# Patient Record
Sex: Male | Born: 1960 | Race: Black or African American | Hispanic: No | Marital: Married | Smoking: Never smoker
Health system: Southern US, Community
[De-identification: ages and names within clinical notes are randomized; demographics above are authoritative.]

## PROBLEM LIST (undated history)

## (undated) ENCOUNTER — Emergency Department (HOSPITAL_COMMUNITY): Discharge status: Absent without leave | Payer: No Typology Code available for payment source

## (undated) ENCOUNTER — Ambulatory Visit (HOSPITAL_COMMUNITY): Source: Home / Self Care

## (undated) DIAGNOSIS — I10 Essential (primary) hypertension: Secondary | ICD-10-CM

## (undated) DIAGNOSIS — G43909 Migraine, unspecified, not intractable, without status migrainosus: Secondary | ICD-10-CM

## (undated) DIAGNOSIS — R339 Retention of urine, unspecified: Secondary | ICD-10-CM

---

## 2014-07-05 DIAGNOSIS — R109 Unspecified abdominal pain: Secondary | ICD-10-CM | POA: Insufficient documentation

## 2014-07-05 DIAGNOSIS — N138 Other obstructive and reflux uropathy: Secondary | ICD-10-CM | POA: Insufficient documentation

## 2014-07-05 DIAGNOSIS — N401 Enlarged prostate with lower urinary tract symptoms: Secondary | ICD-10-CM | POA: Insufficient documentation

## 2014-09-14 DIAGNOSIS — G8929 Other chronic pain: Secondary | ICD-10-CM | POA: Insufficient documentation

## 2014-09-14 DIAGNOSIS — K644 Residual hemorrhoidal skin tags: Secondary | ICD-10-CM | POA: Insufficient documentation

## 2014-09-14 DIAGNOSIS — M545 Low back pain, unspecified: Secondary | ICD-10-CM | POA: Insufficient documentation

## 2017-10-23 DIAGNOSIS — I10 Essential (primary) hypertension: Secondary | ICD-10-CM | POA: Insufficient documentation

## 2017-10-23 DIAGNOSIS — F5102 Adjustment insomnia: Secondary | ICD-10-CM | POA: Insufficient documentation

## 2018-04-02 DIAGNOSIS — E119 Type 2 diabetes mellitus without complications: Secondary | ICD-10-CM | POA: Insufficient documentation

## 2018-06-01 ENCOUNTER — Emergency Department (HOSPITAL_COMMUNITY)
Admission: EM | Admit: 2018-06-01 | Discharge: 2018-06-01 | Disposition: A | Discharge status: Home / Self Care | Payer: Medicaid Other | Attending: Emergency Medicine | Admitting: Emergency Medicine

## 2018-06-01 ENCOUNTER — Emergency Department (HOSPITAL_COMMUNITY): Payer: Medicaid Other

## 2018-06-01 ENCOUNTER — Encounter (HOSPITAL_COMMUNITY): Payer: Self-pay

## 2018-06-01 ENCOUNTER — Other Ambulatory Visit: Payer: Self-pay

## 2018-06-01 DIAGNOSIS — R51 Headache: Secondary | ICD-10-CM | POA: Diagnosis not present

## 2018-06-01 DIAGNOSIS — M542 Cervicalgia: Secondary | ICD-10-CM | POA: Insufficient documentation

## 2018-06-01 DIAGNOSIS — M546 Pain in thoracic spine: Secondary | ICD-10-CM | POA: Diagnosis not present

## 2018-06-01 HISTORY — DX: Retention of urine, unspecified: R33.9

## 2018-06-01 HISTORY — DX: Essential (primary) hypertension: I10

## 2018-06-01 MED ORDER — KETOROLAC TROMETHAMINE 30 MG/ML IJ SOLN
15.0000 mg | Freq: Once | INTRAMUSCULAR | Status: AC
  Administered 2018-06-01: 19:00:00 15 mg via INTRAMUSCULAR
  Filled 2018-06-01: qty 1
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
## 2018-07-09 ENCOUNTER — Emergency Department (HOSPITAL_COMMUNITY)
Admission: EM | Admit: 2018-07-09 | Discharge: 2018-07-09 | Disposition: A | Discharge status: Home / Self Care | Payer: Medicaid Other | Attending: Emergency Medicine | Admitting: Emergency Medicine

## 2018-07-09 ENCOUNTER — Other Ambulatory Visit: Payer: Self-pay

## 2018-07-09 ENCOUNTER — Encounter (HOSPITAL_COMMUNITY): Payer: Self-pay | Admitting: Emergency Medicine

## 2018-07-09 DIAGNOSIS — M542 Cervicalgia: Secondary | ICD-10-CM | POA: Diagnosis not present

## 2018-07-09 DIAGNOSIS — I10 Essential (primary) hypertension: Secondary | ICD-10-CM | POA: Insufficient documentation

## 2018-07-09 HISTORY — DX: Migraine, unspecified, not intractable, without status migrainosus: G43.909

## 2018-07-09 MED ORDER — NAPROXEN 500 MG PO TABS: 500.0000 mg | ORAL_TABLET | Freq: Two times a day (BID) | ORAL | 0 refills | Status: DC

## 2018-07-09 NOTE — ED Notes (Signed)
Patient verbalizes understanding of discharge instructions. Opportunity for questioning and answers were provided. Armband removed by staff, pt discharged from ED.  

## 2018-07-09 NOTE — Discharge Instructions (Addendum)
????? ??? ?? ?? ???? ???? ??? ??? ?? ????? ???? ?? ????? ???? ????? ?? ??? ??   500 ??? ? 500 ??? 1000 ?.?. ???? ? 4000 ?.?. ? Tylenol ??????  ???? ??? ???? ????? ???? ? ???? ??? ????? ? ???? ? 20 ????? ??? 2-3 ?? ??? ?? ?? ?????? ?? ?? ??? ???? ???? ???? ??  ?????? ???? ????? ???? ???? ??? ?????? ?????? ????? ???? ??  ??? ???? ? ???? ? ???? ???? ?? ?????? ??? ?????? ?????? ?? ?? ????? ??? ???? ??  Take naproxen twice daily with food for your neck pain and headaches.  You can also take 500 to 1000 mg of Tylenol every 6 hours as needed for pain.  Do not exceed more than 4000 mg of Tylenol daily.  You can apply ice or heat, whichever feels best, 20 minutes at a time 2-3 times daily to areas of pain and do some gentle stretching.  I have attached information for local doctors offices that you can call and set up an appointment.  Return to the emergency department if any concerning signs or symptoms develop such as fevers, weakness, vision changes.

## 2018-07-09 NOTE — ED Triage Notes (Signed)
Patient reports headache x 1 week - endorses history of headaches and takes medication for it and for HTN but states it is still there, though it is very mild today. Denies other symptoms - no fevers/chills, N/V, dizziness.

## 2018-07-09 NOTE — ED Provider Notes (Signed)
[facility] EMERGENCY DEPARTMENT Provider Note   CSN: [id number] Arrival date & time: 07/09/18  1250    History   Chief Complaint Chief Complaint  Patient presents with  . Headache    HPI Corey Mason is a 58 y.o. male with history of hypertension, migraines, type 2 diabetes mellitus, hyperlipidemia, BPH presents for evaluation of left-sided neck pain for 1 week.  He reports the pain is mild, improves with Tylenol.  It does not radiate down the left upper extremity but at times radiates up to the occiput, and is not associated with any numbness or weakness of the upper extremities.  He denies.  No vision changes.  Denies nausea, vomiting, chest pain, shortness of breath.  He reports he had similar pains in the past.  Recent injuries.  He is also requesting resources to establish care with a primary care provider in the area. He speaks Amharic and Arabic, and I was able to communicate with him as Arabic is my first language.      The history is provided by the patient. The history is limited by a language barrier.    Past Medical History:  Diagnosis Date  . Hypertension   . Migraines     There are no active problems to display for this patient.      Home Medications    Prior to Admission medications   Medication Sig Start Date End Date Taking? Authorizing Provider  naproxen (NAPROSYN) 500 MG tablet Take 1 tablet (500 mg total) by mouth 2 (two) times daily with a meal. 07/09/18   Nils Flack, Gavin Pound, PA-C    Family History No family history on file.  Social History Social History   Tobacco Use  . Smoking status: Not on file  Substance Use Topics  . Alcohol use: Not on file  . Drug use: Not on file     Allergies   Patient has no allergy information on record.   Review of Systems Review of Systems  Constitutional: Negative for chills and fever.  Eyes: Negative for photophobia and visual disturbance.  Gastrointestinal: Negative for nausea.   Musculoskeletal: Positive for neck pain. Negative for back pain and neck stiffness.  Neurological: Negative for weakness and numbness.  All other systems reviewed and are negative.    Physical Exam Updated Vital Signs BP 133/73 (BP Location: Right Arm)   Pulse 73   Temp 99 F (37.2 C) (Oral)   Resp 16   SpO2 99%   Physical Exam Vitals signs and nursing note reviewed.  Constitutional:      General: He is not in acute distress.    Appearance: He is well-developed.  HENT:     Head: Normocephalic and atraumatic.  Eyes:     General:        Right eye: No discharge.        Left eye: No discharge.     Extraocular Movements: Extraocular movements intact.     Conjunctiva/sclera: Conjunctivae normal.     Pupils: Pupils are equal, round, and reactive to light.  Neck:     Musculoskeletal: Normal range of motion and neck supple. No neck rigidity.     Vascular: No JVD.     Trachea: No tracheal deviation.     Meningeal: Brudzinski's sign and Kernig's sign absent.     Comments: No midline cervical spine tenderness.  Mild left cervical muscle tenderness overlying the trapezius and splenius capitis insertion point. Cardiovascular:     Rate and Rhythm:  Normal rate.  Pulmonary:     Effort: Pulmonary effort is normal.  Abdominal:     General: There is no distension.     Tenderness: There is no abdominal tenderness.  Musculoskeletal:        General: No tenderness.     Comments: 5/5 strength of BUE major muscle groups.  Skin:    General: Skin is dry.     Findings: No erythema.  Neurological:     Mental Status: He is alert. Mental status is at baseline.     GCS: GCS eye subscore is 4. GCS verbal subscore is 5. GCS motor subscore is 6.     Cranial Nerves: No cranial nerve deficit, dysarthria or facial asymmetry.     Sensory: No sensory deficit.     Motor: No weakness.     Coordination: Coordination normal.     Gait: Gait normal.  Psychiatric:        Behavior: Behavior normal.       ED Treatments / Results  Labs (all labs ordered are listed, but only abnormal results are displayed) Labs Reviewed - No data to display  EKG None  Radiology No results found.  Procedures Procedures (including critical care time)  Medications Ordered in ED Medications - No data to display   Initial Impression / Assessment and Plan / ED Course  I have reviewed the triage vital signs and the nursing notes.  Pertinent labs & imaging results that were available during my care of the patient were reviewed by me and considered in my medical decision making (see chart for details).        Patient with mild left-sided neck pains for 1 week.  History of similar.  He is afebrile, vital signs are stable.  He is nontoxic in appearance.  He is neurovascularly intact, ambulatory without difficulty.  No focal neurologic deficits on assessment.  Suspect musculoskeletal pain versus muscle spasm versus torticollis.  Will discharge with anti-inflammatories.  We discussed appropriate use of medications, conservative therapy with heat and stretching.  Also discussed his other home medications and I will discharge him with resources for follow-up with a primary care provider on an outpatient basis.  We discussed strict ED return precautions. Patient verbalized understanding of and agreement with plan and is safe for discharge home at this time.   Final Clinical Impressions(s) / ED Diagnoses   Final diagnoses:  Neck pain on left side    ED Discharge Orders         Ordered    naproxen (NAPROSYN) 500 MG tablet  2 times daily with meals     07/09/18 1335           Jeanie SewerFawze, Terrace Fontanilla A, PA-C 07/09/18 1341    Cathren LaineSteinl, Kevin, MD 07/11/18 1431

## 2018-07-10 ENCOUNTER — Encounter (HOSPITAL_COMMUNITY): Payer: Self-pay

## 2019-03-25 ENCOUNTER — Encounter (HOSPITAL_COMMUNITY): Payer: Self-pay

## 2019-03-25 ENCOUNTER — Other Ambulatory Visit: Payer: Self-pay

## 2019-03-25 ENCOUNTER — Emergency Department (HOSPITAL_COMMUNITY)
Admission: EM | Admit: 2019-03-25 | Discharge: 2019-03-25 | Disposition: A | Discharge status: Home / Self Care | Payer: Medicaid Other | Attending: Emergency Medicine | Admitting: Emergency Medicine

## 2019-03-25 DIAGNOSIS — I10 Essential (primary) hypertension: Secondary | ICD-10-CM | POA: Insufficient documentation

## 2019-03-25 DIAGNOSIS — Z760 Encounter for issue of repeat prescription: Secondary | ICD-10-CM | POA: Diagnosis not present

## 2019-03-25 MED ORDER — ATORVASTATIN CALCIUM 20 MG PO TABS: 10.0000 mg | ORAL_TABLET | Freq: Every day | ORAL | 0 refills | Status: DC

## 2019-03-25 MED ORDER — AMLODIPINE BESYLATE 10 MG PO TABS: 10.0000 mg | ORAL_TABLET | Freq: Every day | ORAL | 0 refills | Status: DC

## 2019-03-25 NOTE — ED Provider Notes (Signed)
[facility] EMERGENCY DEPARTMENT Provider Note   CSN: [id number] Arrival date & time: 03/25/19  1519     History Chief Complaint  Patient presents with  . Medication Refill    Corey Mason is a 59 y.o. male.  HPI    59 year old male comes in a chief complaint of medication refill. Patient has history of hypertension and hyperlipidemia.  He does not have a PCP in [place], typically follows up in [place] with [facility].  He is running out of his medication and now lives in [place], requesting medication refill and outpatient follow-up.   Past Medical History:  Diagnosis Date  . Hypertension   . Migraines   . Urinary retention     There are no problems to display for this patient.   History reviewed. No pertinent surgical history.     No family history on file.  Social History   Tobacco Use  . Smoking status: Never Smoker  . Smokeless tobacco: Never Used  Substance Use Topics  . Alcohol use: Not Currently  . Drug use: Never    Home Medications Prior to Admission medications   Medication Sig Start Date End Date Taking? Authorizing Provider  amLODipine (NORVASC) 10 MG tablet Take 1 tablet (10 mg total) by mouth daily. 03/25/19   Derwood Kaplan, MD  atorvastatin (LIPITOR) 20 MG tablet Take 0.5 tablets (10 mg total) by mouth daily. 03/25/19   Derwood Kaplan, MD  naproxen (NAPROSYN) 500 MG tablet Take 1 tablet (500 mg total) by mouth 2 (two) times daily with a meal. 07/09/18   Luevenia Maxin, Mina A, PA-C    Allergies    Patient has no allergy information on record.  Review of Systems   Review of Systems  Constitutional: Negative for activity change.  Cardiovascular: Negative for chest pain.    Physical Exam Updated Vital Signs BP 132/75   Pulse 82   Temp 98.4 F (36.9 C) (Oral)   Resp 14   SpO2 100%   Physical Exam Vitals and nursing note reviewed.  Constitutional:      Appearance: He is well-developed.  HENT:       Head: Atraumatic.  Cardiovascular:     Rate and Rhythm: Normal rate.  Pulmonary:     Effort: Pulmonary effort is normal.  Musculoskeletal:     Cervical back: Neck supple.  Skin:    General: Skin is warm.  Neurological:     Mental Status: He is alert and oriented to person, place, and time.     ED Results / Procedures / Treatments   Labs (all labs ordered are listed, but only abnormal results are displayed) Labs Reviewed - No data to display  EKG None  Radiology No results found.  Procedures Procedures (including critical care time)  Medications Ordered in ED Medications - No data to display  ED Course  I have reviewed the triage vital signs and the nursing notes.  Pertinent labs & imaging results that were available during my care of the patient were reviewed by me and considered in my medical decision making (see chart for details).    MDM Rules/Calculators/A&P                      59 year old comes in a chief complaint of medication refill.  He is on amlodipine and atorvastatin.  We will give him at least 60 days worth of meds via prescription.  Outpatient follow-up information provided.  Final Clinical Impression(s) / ED  Diagnoses Final diagnoses:  Medication refill    Rx / DC Orders ED Discharge Orders         Ordered    amLODipine (NORVASC) 10 MG tablet  Daily     03/25/19 1605    atorvastatin (LIPITOR) 20 MG tablet  Daily     03/25/19 1605           Varney Biles, MD 03/25/19 1628

## 2019-03-25 NOTE — Discharge Instructions (Signed)
We have provided you with a list of primary care follow-up options.  Please call one of them and set up an appointment.

## 2019-03-25 NOTE — ED Triage Notes (Signed)
Pt requesting refill of HTN and cholesterol medication, states he ran out today.

## 2019-06-07 ENCOUNTER — Ambulatory Visit: Payer: Medicaid Other | Admitting: Family

## 2019-06-16 ENCOUNTER — Ambulatory Visit: Payer: Medicaid Other | Admitting: Nurse Practitioner

## 2019-06-23 DIAGNOSIS — H6121 Impacted cerumen, right ear: Secondary | ICD-10-CM

## 2019-06-23 HISTORY — DX: Impacted cerumen, right ear: H61.21

## 2019-11-15 ENCOUNTER — Ambulatory Visit (HOSPITAL_COMMUNITY)
Admission: EM | Admit: 2019-11-15 | Discharge: 2019-11-15 | Disposition: A | Discharge status: Home / Self Care | Payer: Medicaid Other | Attending: Family Medicine | Admitting: Family Medicine

## 2019-11-15 ENCOUNTER — Other Ambulatory Visit: Payer: Self-pay

## 2019-11-15 ENCOUNTER — Encounter (HOSPITAL_COMMUNITY): Payer: Self-pay

## 2019-11-15 DIAGNOSIS — I10 Essential (primary) hypertension: Secondary | ICD-10-CM | POA: Diagnosis not present

## 2019-11-15 DIAGNOSIS — Z760 Encounter for issue of repeat prescription: Secondary | ICD-10-CM

## 2019-11-15 MED ORDER — ATORVASTATIN CALCIUM 20 MG PO TABS: 10.0000 mg | ORAL_TABLET | Freq: Every day | ORAL | 0 refills | Status: DC

## 2019-11-15 MED ORDER — AMLODIPINE BESYLATE 10 MG PO TABS: 10.0000 mg | ORAL_TABLET | Freq: Every day | ORAL | 0 refills | Status: DC

## 2019-11-15 NOTE — Discharge Instructions (Signed)
Medicines are refilled Follow up with your primary care doctor

## 2019-11-15 NOTE — ED Provider Notes (Signed)
[facility]    CSN: [id number] Arrival date & time: 11/15/19  1043      History   Chief Complaint Chief Complaint  Patient presents with  . Medication Refill    HPI Corey Mason is a 59 y.o. male.   HPI  Patient has hypertension hyperlipidemia.  He takes amlodipine 10 mg a day and atorvastatin 20 mg a day.  He went to his primary care office on 11/11/2019.  He got a prescription for Flomax but he did not get prescription refills for his medicines above.  He thinks that he may be limited with a language problem.  He is here requesting med refills.  He states the medicines work well and he is feeling fine today   Past Medical History:  Diagnosis Date  . Hypertension   . Migraines   . Urinary retention     There are no problems to display for this patient.   History reviewed. No pertinent surgical history.     Home Medications    Prior to Admission medications   Medication Sig Start Date End Date Taking? Authorizing Provider  tamsulosin (FLOMAX) 0.4 MG CAPS capsule Take by mouth. 11/11/19  Yes [provider]  amLODipine (NORVASC) 10 MG tablet Take 1 tablet (10 mg total) by mouth daily. 11/15/19   Eustace Moore, MD  atorvastatin (LIPITOR) 20 MG tablet Take 0.5 tablets (10 mg total) by mouth daily. 11/15/19   Eustace Moore, MD  naproxen (NAPROSYN) 500 MG tablet Take 1 tablet (500 mg total) by mouth 2 (two) times daily with a meal. 07/09/18   Fawze, Mina A, PA-C  tamsulosin (FLOMAX) 0.4 MG CAPS capsule Take 0.4 mg by mouth daily. 09/05/19   [provider]    Family History History reviewed. No pertinent family history.  Social History Social History   Tobacco Use  . Smoking status: Never Smoker  . Smokeless tobacco: Never Used  Substance Use Topics  . Alcohol use: Not Currently  . Drug use: Never     Allergies   Patient has no known allergies.   Review of Systems Review of Systems See HPI  Physical  Exam Triage Vital Signs ED Triage Vitals [11/15/19 1230]  Enc Vitals Group     BP 130/74     Pulse Rate 78     Resp      Temp 97.8 F (36.6 C)     Temp Source Oral     SpO2 98 %     Weight      Height      Head Circumference      Peak Flow      Pain Score 0     Pain Loc      Pain Edu?      Excl. in GC?    No data found.  Updated Vital Signs BP 130/74 (BP Location: Left Arm)   Pulse 78   Temp 97.8 F (36.6 C) (Oral)   SpO2 98%     Physical Exam Constitutional:      General: He is not in acute distress.    Appearance: He is well-developed.     Comments: Exam by observation  HENT:     Head: Normocephalic and atraumatic.     Mouth/Throat:     Comments: Mask in place Eyes:     Conjunctiva/sclera: Conjunctivae normal.     Pupils: Pupils are equal, round, and reactive to light.  Cardiovascular:     Rate and Rhythm:  Normal rate.  Pulmonary:     Effort: Pulmonary effort is normal. No respiratory distress.  Abdominal:     Palpations: Abdomen is soft.  Musculoskeletal:        General: Normal range of motion.     Cervical back: Normal range of motion.  Skin:    General: Skin is warm and dry.  Neurological:     Mental Status: He is alert.  Psychiatric:        Behavior: Behavior normal.      UC Treatments / Results  Labs (all labs ordered are listed, but only abnormal results are displayed) Labs Reviewed - No data to display  EKG   Radiology No results found.  Procedures Procedures (including critical care time)  Medications Ordered in UC Medications - No data to display  Initial Impression / Assessment and Plan / UC Course  I have reviewed the triage vital signs and the nursing notes.  Pertinent labs & imaging results that were available during my care of the patient were reviewed by me and considered in my medical decision making (see chart for details).     Final Clinical Impressions(s) / UC Diagnoses   Final diagnoses:  Hypertension,  unspecified type  Encounter for medication refill     Discharge Instructions     Medicines are refilled Follow up with your primary care doctor   ED Prescriptions    Medication Sig Dispense Auth. Provider   amLODipine (NORVASC) 10 MG tablet Take 1 tablet (10 mg total) by mouth daily. 90 tablet Eustace Moore, MD   atorvastatin (LIPITOR) 20 MG tablet Take 0.5 tablets (10 mg total) by mouth daily. 90 tablet Eustace Moore, MD     PDMP not reviewed this encounter.   Eustace Moore, MD 11/15/19 1316

## 2019-11-15 NOTE — ED Triage Notes (Signed)
Pt reports he needs atorvastatin 20 mg refill and amlodipine 10 mg. Pt reports he has a PCP and he did not sent a refill for amlodipine to the pharmacy when he call.

## 2019-11-30 ENCOUNTER — Other Ambulatory Visit: Payer: Self-pay

## 2019-11-30 ENCOUNTER — Encounter (HOSPITAL_COMMUNITY): Payer: Self-pay

## 2019-11-30 ENCOUNTER — Ambulatory Visit (HOSPITAL_COMMUNITY)
Admission: EM | Admit: 2019-11-30 | Discharge: 2019-11-30 | Disposition: A | Discharge status: Home / Self Care | Payer: Medicaid Other | Attending: Family Medicine | Admitting: Family Medicine

## 2019-11-30 DIAGNOSIS — R1031 Right lower quadrant pain: Secondary | ICD-10-CM | POA: Insufficient documentation

## 2019-11-30 DIAGNOSIS — R1032 Left lower quadrant pain: Secondary | ICD-10-CM | POA: Diagnosis present

## 2019-11-30 DIAGNOSIS — R197 Diarrhea, unspecified: Secondary | ICD-10-CM | POA: Diagnosis present

## 2019-11-30 LAB — CBC WITH DIFFERENTIAL/PLATELET
Abs Immature Granulocytes: 0.03 10*3/uL (ref 0.00–0.07)
Basophils Absolute: 0 10*3/uL (ref 0.0–0.1)
Basophils Relative: 0 %
Eosinophils Absolute: 0.3 10*3/uL (ref 0.0–0.5)
Eosinophils Relative: 3 %
HCT: 47.3 % (ref 39.0–52.0)
Hemoglobin: 15.8 g/dL (ref 13.0–17.0)
Immature Granulocytes: 0 %
Lymphocytes Relative: 16 %
Lymphs Abs: 1.7 10*3/uL (ref 0.7–4.0)
MCH: 29.4 pg (ref 26.0–34.0)
MCHC: 33.4 g/dL (ref 30.0–36.0)
MCV: 88.1 fL (ref 80.0–100.0)
Monocytes Absolute: 0.6 10*3/uL (ref 0.1–1.0)
Monocytes Relative: 6 %
Neutro Abs: 7.7 10*3/uL (ref 1.7–7.7)
Neutrophils Relative %: 75 %
Platelets: 261 10*3/uL (ref 150–400)
RBC: 5.37 MIL/uL (ref 4.22–5.81)
RDW: 12.1 % (ref 11.5–15.5)
WBC: 10.3 10*3/uL (ref 4.0–10.5)
nRBC: 0 % (ref 0.0–0.2)

## 2019-11-30 LAB — GASTROINTESTINAL PANEL BY PCR, STOOL (REPLACES STOOL CULTURE)

## 2019-11-30 LAB — COMPREHENSIVE METABOLIC PANEL
ALT: 21 U/L (ref 0–44)
AST: 19 U/L (ref 15–41)
Albumin: 4.3 g/dL (ref 3.5–5.0)
Alkaline Phosphatase: 70 U/L (ref 38–126)
Anion gap: 6 (ref 5–15)
BUN: 10 mg/dL (ref 6–20)
CO2: 28 mmol/L (ref 22–32)
Calcium: 9.2 mg/dL (ref 8.9–10.3)
Chloride: 102 mmol/L (ref 98–111)
Creatinine, Ser: 0.87 mg/dL (ref 0.61–1.24)
GFR, Estimated: >60 mL/min (ref >60)
Glucose, Bld: 119 mg/dL — ABNORMAL HIGH (ref 70–99)
Potassium: 4.2 mmol/L (ref 3.5–5.1)
Sodium: 136 mmol/L (ref 135–145)
Total Bilirubin: 0.7 mg/dL (ref 0.3–1.2)
Total Protein: 7.5 g/dL (ref 6.5–8.1)

## 2019-11-30 LAB — LIPASE, BLOOD: Lipase: 36 U/L (ref 11–51)

## 2019-11-30 NOTE — ED Triage Notes (Signed)
Pt c/o significant abdominal pain to umbilical and central abdomen region onset yesterday. Pt states that he ate a "vegetables" yesterday and then had a BM that was "very broken" and not normal for pt. States his abdominal pain is better this morning.  Reports h/o abdominal pain intermittently for several years and has had a decreased appetite, but tolerates food well. Ate breakfast today. Also reports h/o hemorrhoids and has some pain in the rectal area at present.  Denies n/v/d, fever, dysuria symptoms or other complaint.  Abdomen firm.

## 2019-11-30 NOTE — ED Notes (Signed)
Patient is in bathroom

## 2019-11-30 NOTE — Discharge Instructions (Signed)
Please do your best to ensure adequate fluid intake in order to avoid dehydration. If you find that you are unable to tolerate drinking fluids regularly please proceed to the Emergency Department for evaluation.  Also, you should return to the hospital if you experience persistent fevers for greater than 1-2 more days, increasing abdominal pain, worsening diarrhea, dizziness, syncope (fainting), or for any other concerns you may find worrisome.

## 2019-11-30 NOTE — ED Provider Notes (Signed)
[facility] [place] [facility]   [id number] 11/30/19 Arrival Time: 1014  ASSESSMENT & PLAN:  1. Bilateral lower abdominal discomfort   2. Diarrhea, unspecified type     Benign abdominal exam. No indications for urgent abdominal/pelvic imaging at this time. Discussed. No signs of dehydration requiring IVF.  Pending: Labs Reviewed  GASTROINTESTINAL PANEL BY PCR, STOOL (REPLACES STOOL CULTURE)  CBC WITH DIFFERENTIAL/PLATELET  COMPREHENSIVE METABOLIC PANEL  LIPASE, BLOOD      Discharge Instructions     Please do your best to ensure adequate fluid intake in order to avoid dehydration. If you find that you are unable to tolerate drinking fluids regularly please proceed to the Emergency Department for evaluation.  Also, you should return to the hospital if you experience persistent fevers for greater than 1-2 more days, increasing abdominal pain, worsening diarrhea, dizziness, syncope (fainting), or for any other concerns you may find worrisome.     Follow-up Information    [place], [facility].   Specialty: Internal Medicine Why: As needed. Contact information: [address] [place] [address] [phone]                 Reviewed expectations re: course of current medical issues. Questions answered. Outlined signs and symptoms indicating need for more acute intervention. Patient verbalized understanding. After Visit Summary given.   SUBJECTIVE: History from: patient. Corey Mason is a 59 y.o. male who presents with complaint of intermittent lower abdominal discomfort; feels maybe over the past week; overall unsure of duration. Associated with non-bloody loose stools. No fever reported. No associated n/v. Pain does not wake him at night but reports loose stool during night. Tolerating PO fluids; decreased appetite.   History reviewed. No pertinent surgical history.   OBJECTIVE:  Vitals:   11/30/19 1133  BP: (!) 139/91  Pulse: 73  Resp:  18  Temp: 98 F (36.7 C)  TempSrc: Oral  SpO2: 100%    General appearance: alert, oriented, no acute distress HEENT: [place]; AT; oropharynx moist Lungs: unlabored respirations Abdomen: soft; without distention; no specific tenderness to palpation; normal bowel sounds; without masses or organomegaly; without guarding or rebound tenderness Back: without reported CVA tenderness; FROM at waist Extremities: without LE edema; symmetrical; without gross deformities Skin: warm and dry Neurologic: normal gait Psychological: alert and cooperative; normal mood and affect  Labs: No results found for this or any previous visit. Labs Reviewed  GASTROINTESTINAL PANEL BY PCR, STOOL (REPLACES STOOL CULTURE)  CBC WITH DIFFERENTIAL/PLATELET  COMPREHENSIVE METABOLIC PANEL  LIPASE, BLOOD    No Known Allergies                                             Past Medical History:  Diagnosis Date   Hypertension    Migraines    Urinary retention     Social History   Socioeconomic History   Marital status: Married    Spouse name: Not on file   Number of children: Not on file   Years of education: Not on file   Highest education level: Not on file  Occupational History   Not on file  Tobacco Use   Smoking status: Never Smoker   Smokeless tobacco: Never Used  Substance and Sexual Activity   Alcohol use: Not Currently   Drug use: Never   Sexual activity: Not Currently  Other Topics Concern  Not on file  Social History Narrative   ** Merged History Encounter **       Social Determinants of Health   Financial Resource Strain:    Difficulty of Paying Living Expenses: Not on file  Food Insecurity:    Worried About Programme researcher, broadcasting/film/video in the Last Year: Not on file   [employer] of Food in the Last Year: Not on file  Transportation Needs:    Lack of Transportation (Medical): Not on file   Lack of Transportation (Non-Medical): Not on file  Physical Activity:    Days of  Exercise per Week: Not on file   Minutes of Exercise per Session: Not on file  Stress:    Feeling of Stress : Not on file  Social Connections:    Frequency of Communication with Friends and Family: Not on file   Frequency of Social Gatherings with Friends and Family: Not on file   Attends Religious Services: Not on file   Active Member of Clubs or Organizations: Not on file   Attends Banker Meetings: Not on file   Marital Status: Not on file  Intimate Partner Violence:    Fear of Current or Ex-Partner: Not on file   Emotionally Abused: Not on file   Physically Abused: Not on file   Sexually Abused: Not on file    History reviewed. No pertinent family history.   Mardella Layman, MD 11/30/19 1316

## 2019-11-30 NOTE — ED Notes (Signed)
Stool sample labeled and placed in lab.  Dr hagler saw sample, minimal, blood tinged.  Notified tiffany, rad/lab that this stool sample for lab test
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
## 2020-09-12 ENCOUNTER — Ambulatory Visit: Payer: Medicaid Other | Attending: Critical Care Medicine | Admitting: Critical Care Medicine

## 2020-09-12 ENCOUNTER — Other Ambulatory Visit: Payer: Self-pay

## 2020-09-12 ENCOUNTER — Encounter: Payer: Self-pay | Admitting: Critical Care Medicine

## 2020-09-12 VITALS — BP 125/85

## 2020-09-12 DIAGNOSIS — N401 Enlarged prostate with lower urinary tract symptoms: Secondary | ICD-10-CM

## 2020-09-12 DIAGNOSIS — Z79899 Other long term (current) drug therapy: Secondary | ICD-10-CM | POA: Diagnosis not present

## 2020-09-12 DIAGNOSIS — I10 Essential (primary) hypertension: Secondary | ICD-10-CM | POA: Diagnosis not present

## 2020-09-12 DIAGNOSIS — M545 Low back pain, unspecified: Secondary | ICD-10-CM | POA: Diagnosis not present

## 2020-09-12 DIAGNOSIS — Z1159 Encounter for screening for other viral diseases: Secondary | ICD-10-CM | POA: Insufficient documentation

## 2020-09-12 DIAGNOSIS — F5102 Adjustment insomnia: Secondary | ICD-10-CM

## 2020-09-12 DIAGNOSIS — Z114 Encounter for screening for human immunodeficiency virus [HIV]: Secondary | ICD-10-CM | POA: Insufficient documentation

## 2020-09-12 DIAGNOSIS — E119 Type 2 diabetes mellitus without complications: Secondary | ICD-10-CM | POA: Insufficient documentation

## 2020-09-12 DIAGNOSIS — G8929 Other chronic pain: Secondary | ICD-10-CM

## 2020-09-12 MED ORDER — BLOOD GLUCOSE METER KIT: PACK | 0 refills | Status: DC

## 2020-09-12 NOTE — Assessment & Plan Note (Signed)
Current low back pain is worse when driving long distances currently using over-the-counter ibuprofen

## 2020-09-12 NOTE — Assessment & Plan Note (Signed)
Type 2 diabetes no recent blood glucose measurements we will bring the patient in for an A1c and direct blood examination and continue metformin at current prescription level

## 2020-09-12 NOTE — Assessment & Plan Note (Signed)
Patient denies any evidence of insomnia at this time

## 2020-09-12 NOTE — Assessment & Plan Note (Signed)
No recent blood pressure right measurements we will continue current medications and bring the patient back in for direct exam in the next month

## 2020-09-12 NOTE — Assessment & Plan Note (Signed)
Patient is seen urology recently had normal levels of PSA obtained no evidence of prostatic cancer  Continue tamsulosin

## 2020-09-12 NOTE — Addendum Note (Signed)
Addended by: Storm Frisk on: 09/12/2020 03:31 PM   Modules accepted: Orders

## 2020-09-12 NOTE — Progress Notes (Signed)
New Patient Office Visit  Subjective:  Patient ID: Corey Mason, male    DOB: [birth year]  Age: 60 y.o. MRN: [id number] Virtual Visit via Video Note  I connected with Corey Mason   on 09/12/20 at 11am by a video enabled telemedicine application and verified that I am speaking with the correct person using two identifiers.   Consent:  I discussed the limitations, risks, security and privacy concerns of performing an evaluation and management service by video visit and the availability of in person appointments. I also discussed with the patient that there may be a patient responsible charge related to this service. The patient expressed understanding and agreed to proceed.  Location of patient: Patient was in his car  Location of provider: I am in my office  Persons participating in the televisit with the patient.   Language barrier interpretation of the patient's native language provided by [employer] video interpreter for Amharic language Corey Mason #[id number]    History of Present Illness:   CC: PCP to est   HPI Corey Mason presents for pcp to establish. This patient is a 60 year old male who has been in [place] for about 20 years originally from [place].  This visit is assisted with his native language interpreter from [employer] video interpreter services.  The patient has history of hypertension diabetes hypercholesterolemia and benign prostatic hypertrophy.  He is normally on metformin Flomax atorvastatin and amlodipine.  He states his urine output has been good.  He does not measure his blood sugars he is out of his testing supplies at this time.  He does have Medicaid [employer].  Patient has no real complaints at this visit.  Mental health is good. Patient does have mild lower back pain.  Past Medical History:  Diagnosis Date   Hypertension    Migraines    Urinary retention     History reviewed. No pertinent surgical history.  History reviewed. No pertinent family  history.  Social History   Socioeconomic History   Marital status: Married    Spouse name: Not on file   Number of children: Not on file   Years of education: Not on file   Highest education level: Not on file  Occupational History   Not on file  Tobacco Use   Smoking status: Never   Smokeless tobacco: Never  Substance and Sexual Activity   Alcohol use: Not Currently   Drug use: Never   Sexual activity: Not Currently  Other Topics Concern   Not on file  Social History Narrative   ** Merged History Encounter **       Social Determinants of Health   Financial Resource Strain: Not on file  Food Insecurity: Not on file  Transportation Needs: Not on file  Physical Activity: Not on file  Stress: Not on file  Social Connections: Not on file  Intimate Partner Violence: Not on file    ROS Review of Systems  Respiratory: Negative.    Cardiovascular: Negative.   Gastrointestinal: Negative.   Genitourinary: Negative.   Musculoskeletal:  Positive for back pain.  Skin:  Negative for rash.  Neurological: Negative.   Psychiatric/Behavioral:  Negative for dysphoric mood and sleep disturbance. The patient is not nervous/anxious.    Objective:   Today's Vitals: There were no vitals taken for this visit. Patient is in no distress on the video system no exam is performed Physical Exam  Assessment & Plan:   Problem List Items Addressed This Visit  Cardiovascular and Mediastinum   Essential hypertension    No recent blood pressure right measurements we will continue current medications and bring the patient back in for direct exam in the next month      Relevant Medications   amLODipine (NORVASC) 10 MG tablet   Other Relevant Orders   CBC with Differential/Platelet     Endocrine   Type 2 diabetes mellitus without complication, without long-term current use of insulin (HCC)    Type 2 diabetes no recent blood glucose measurements we will bring the patient in for an  A1c and direct blood examination and continue metformin at current prescription level      Relevant Orders   Comprehensive metabolic panel   Lipid panel   Microalbumin / creatinine urine ratio   Hemoglobin A1c     Genitourinary   Benign non-nodular prostatic hyperplasia with lower urinary tract symptoms    Patient is seen urology recently had normal levels of PSA obtained no evidence of prostatic cancer  Continue tamsulosin        Other   Chronic left-sided low back pain without sciatica    Current low back pain is worse when driving long distances currently using over-the-counter ibuprofen      RESOLVED: Adjustment insomnia    Patient denies any evidence of insomnia at this time      Other Visit Diagnoses     Need for hepatitis C screening test    -  Primary   Relevant Orders   HCV Ab w Reflex to Quant PCR   Encounter for screening for HIV       Relevant Orders   HIV Antibody (routine testing w rflx)       Outpatient Encounter Medications as of 09/12/2020  Medication Sig   amLODipine (NORVASC) 10 MG tablet Take 1 tablet by mouth daily.   [DISCONTINUED] atorvastatin (LIPITOR) 20 MG tablet Take 1 tablet by mouth daily.   atorvastatin (LIPITOR) 20 MG tablet Take 0.5 tablets (10 mg total) by mouth daily.   tamsulosin (FLOMAX) 0.4 MG CAPS capsule Take by mouth.   [DISCONTINUED] amLODipine (NORVASC) 10 MG tablet Take 1 tablet (10 mg total) by mouth daily.   [DISCONTINUED] naproxen (NAPROSYN) 500 MG tablet Take 1 tablet (500 mg total) by mouth 2 (two) times daily with a meal.   [DISCONTINUED] tamsulosin (FLOMAX) 0.4 MG CAPS capsule Take 0.4 mg by mouth daily.   No facility-administered encounter medications on file as of 09/12/2020.    Follow-up: Return in about 1 month (around 10/13/2020).  Follow Up Instructions: Patient knows a direct exam visit will occur in the next month   I discussed the assessment and treatment plan with the patient. The patient was provided an  opportunity to ask questions and all were answered. The patient agreed with the plan and demonstrated an understanding of the instructions.   The patient was advised to call back or seek an in-person evaluation if the symptoms worsen or if the condition fails to improve as anticipated.  I provided 38 minutes of non-face-to-face time during this encounter  including  median intraservice time , review of notes, labs, imaging, medications  and explaining diagnosis and management to the patient .    Shan Levans, MD Shan Levans, MD

## 2020-09-13 ENCOUNTER — Other Ambulatory Visit: Payer: Self-pay | Admitting: Critical Care Medicine

## 2020-09-13 LAB — COMPREHENSIVE METABOLIC PANEL
ALT: 11 IU/L (ref 0–44)
AST: 16 IU/L (ref 0–40)
Albumin/Globulin Ratio: 1.8 (ref 1.2–2.2)
Albumin: 4.8 g/dL (ref 3.8–4.9)
Alkaline Phosphatase: 77 IU/L (ref 44–121)
BUN/Creatinine Ratio: 9 (ref 9–20)
BUN: 8 mg/dL (ref 6–24)
Bilirubin Total: 0.6 mg/dL (ref 0.0–1.2)
CO2: 27 mmol/L (ref 20–29)
Calcium: 9.6 mg/dL (ref 8.7–10.2)
Chloride: 101 mmol/L (ref 96–106)
Creatinine, Ser: 0.88 mg/dL (ref 0.76–1.27)
Globulin, Total: 2.6 g/dL (ref 1.5–4.5)
Glucose: 118 mg/dL — ABNORMAL HIGH (ref 65–99)
Potassium: 4.7 mmol/L (ref 3.5–5.2)
Sodium: 141 mmol/L (ref 134–144)
Total Protein: 7.4 g/dL (ref 6.0–8.5)
eGFR: 100 mL/min/{1.73_m2} (ref >59)

## 2020-09-13 LAB — MICROALBUMIN / CREATININE URINE RATIO
Creatinine, Urine: 168.9 mg/dL
Microalb/Creat Ratio: 5 mg/g creat (ref 0–29)
Microalbumin, Urine: 8.7 ug/mL

## 2020-09-13 LAB — CBC WITH DIFFERENTIAL/PLATELET
Basophils Absolute: 0 10*3/uL (ref 0.0–0.2)
Basos: 0 %
EOS (ABSOLUTE): 0.2 10*3/uL (ref 0.0–0.4)
Eos: 4 %
Hematocrit: 45.4 % (ref 37.5–51.0)
Hemoglobin: 15.4 g/dL (ref 13.0–17.7)
Immature Grans (Abs): 0 10*3/uL (ref 0.0–0.1)
Immature Granulocytes: 0 %
Lymphocytes Absolute: 1.2 10*3/uL (ref 0.7–3.1)
Lymphs: 18 %
MCH: 28.9 pg (ref 26.6–33.0)
MCHC: 33.9 g/dL (ref 31.5–35.7)
MCV: 85 fL (ref 79–97)
Monocytes Absolute: 0.4 10*3/uL (ref 0.1–0.9)
Monocytes: 6 %
Neutrophils Absolute: 4.7 10*3/uL (ref 1.4–7.0)
Neutrophils: 72 %
Platelets: 273 10*3/uL (ref 150–450)
RBC: 5.32 x10E6/uL (ref 4.14–5.80)
RDW: 11.3 % — ABNORMAL LOW (ref 11.6–15.4)
WBC: 6.5 10*3/uL (ref 3.4–10.8)

## 2020-09-13 LAB — LIPID PANEL
Chol/HDL Ratio: 2.4 ratio (ref 0.0–5.0)
Cholesterol, Total: 120 mg/dL (ref 100–199)
HDL: 49 mg/dL (ref >39)
LDL Chol Calc (NIH): 60 mg/dL (ref 0–99)
Triglycerides: 44 mg/dL (ref 0–149)
VLDL Cholesterol Cal: 11 mg/dL (ref 5–40)

## 2020-09-13 LAB — HEMOGLOBIN A1C
Est. average glucose Bld gHb Est-mCnc: 126 mg/dL
Hgb A1c MFr Bld: 6 % — ABNORMAL HIGH (ref 4.8–5.6)

## 2020-09-13 LAB — HCV INTERPRETATION

## 2020-09-13 LAB — HCV AB W REFLEX TO QUANT PCR: HCV Ab: <0.1 s/co ratio (ref 0.0–0.9)

## 2020-09-13 LAB — HIV ANTIBODY (ROUTINE TESTING W REFLEX): HIV Screen 4th Generation wRfx: NONREACTIVE

## 2020-09-13 MED ORDER — AMLODIPINE BESYLATE 10 MG PO TABS: 10.0000 mg | ORAL_TABLET | Freq: Every day | ORAL | 2 refills | Status: DC

## 2020-09-13 MED ORDER — ATORVASTATIN CALCIUM 20 MG PO TABS: 10.0000 mg | ORAL_TABLET | Freq: Every day | ORAL | 1 refills | Status: DC

## 2020-09-13 MED ORDER — METFORMIN HCL ER 500 MG PO TB24: 500.0000 mg | ORAL_TABLET | Freq: Every day | ORAL | 0 refills | Status: DC

## 2020-11-28 ENCOUNTER — Other Ambulatory Visit: Payer: Self-pay

## 2020-11-28 ENCOUNTER — Encounter: Payer: Self-pay | Admitting: Critical Care Medicine

## 2020-11-28 ENCOUNTER — Ambulatory Visit: Payer: Medicaid Other | Attending: Critical Care Medicine | Admitting: Critical Care Medicine

## 2020-11-28 VITALS — BP 139/85 | HR 65 | Resp 16 | Wt 178.8 lb

## 2020-11-28 DIAGNOSIS — Z23 Encounter for immunization: Secondary | ICD-10-CM | POA: Diagnosis not present

## 2020-11-28 DIAGNOSIS — M545 Low back pain, unspecified: Secondary | ICD-10-CM

## 2020-11-28 DIAGNOSIS — E119 Type 2 diabetes mellitus without complications: Secondary | ICD-10-CM

## 2020-11-28 DIAGNOSIS — N401 Enlarged prostate with lower urinary tract symptoms: Secondary | ICD-10-CM

## 2020-11-28 DIAGNOSIS — Z1211 Encounter for screening for malignant neoplasm of colon: Secondary | ICD-10-CM

## 2020-11-28 DIAGNOSIS — I10 Essential (primary) hypertension: Secondary | ICD-10-CM | POA: Diagnosis not present

## 2020-11-28 DIAGNOSIS — G8929 Other chronic pain: Secondary | ICD-10-CM

## 2020-11-28 MED ORDER — AMLODIPINE BESYLATE 10 MG PO TABS: 10.0000 mg | ORAL_TABLET | Freq: Every day | ORAL | 2 refills | Status: DC

## 2020-11-28 MED ORDER — TAMSULOSIN HCL 0.4 MG PO CAPS: 0.4000 mg | ORAL_CAPSULE | Freq: Every day | ORAL | 4 refills | Status: DC

## 2020-11-28 MED ORDER — METFORMIN HCL ER 500 MG PO TB24: 500.0000 mg | ORAL_TABLET | Freq: Every day | ORAL | 0 refills | Status: DC

## 2020-11-28 MED ORDER — ATORVASTATIN CALCIUM 10 MG PO TABS: 10.0000 mg | ORAL_TABLET | Freq: Every day | ORAL | 3 refills | Status: DC

## 2020-11-28 NOTE — Assessment & Plan Note (Signed)
This has resolved with over-the-counter ibuprofen

## 2020-11-28 NOTE — Assessment & Plan Note (Signed)
No issues with bladder continue tamsulosin

## 2020-11-28 NOTE — Assessment & Plan Note (Signed)
Type 2 diabetes well controlled hemoglobin A1c at goal continue metformin daily and atorvastatin

## 2020-11-28 NOTE — Progress Notes (Signed)
Established Patient Office Visit  Subjective:  Patient ID: Corey Mason, male    DOB: [birth year]  Age: 60 y.o. MRN: [id number]  CC:  Chief Complaint  Patient presents with   Hypertension    HPI this visit was assisted by video interpreter rehab [phone] for Amharic language Corey Mason presents for primary care follow-up visit.  Patient has history of hypertension and type 2 diabetes and on arrival blood pressure is 139/85 on amlodipine 10 mg daily.  He has been eating excess salt in his diet.  Patient is taking atorvastatin 20 mg daily he is supposed to be on 10 mg daily.  His last cholesterol screening showed he was at goal at a 10 mg daily dosing of atorvastatin.  Patient's low back pain has improved.  He has no other real complaints.  He does have dental issues and would like a referral to a dentist he also needs diabetic eye screening and will need a referral.  Patient is trying to follow a low carbohydrate diet and he is on metformin daily.  His last A1c was 6.0 in August he does not need repeat at this time.  He does agree to receive flu vaccine.     Past Medical History:  Diagnosis Date   Hypertension    Migraines    Urinary retention     History reviewed. No pertinent surgical history.  History reviewed. No pertinent family history.  Social History   Socioeconomic History   Marital status: Married    Spouse name: Not on file   Number of children: Not on file   Years of education: Not on file   Highest education level: Not on file  Occupational History   Not on file  Tobacco Use   Smoking status: Never   Smokeless tobacco: Never  Substance and Sexual Activity   Alcohol use: Not Currently   Drug use: Never   Sexual activity: Not Currently  Other Topics Concern   Not on file  Social History Narrative   ** Merged History Encounter **       Social Determinants of Health   Financial Resource Strain: Not on file  Food Insecurity: Not on file   Transportation Needs: Not on file  Physical Activity: Not on file  Stress: Not on file  Social Connections: Not on file  Intimate Partner Violence: Not on file    Outpatient Medications Prior to Visit  Medication Sig Dispense Refill   blood glucose meter kit and supplies Use daily to test blood sugar Dispense based on patient and insurance preference. Use up to four times daily as directed. (FOR ICD-10 E10.9, E11.9). 1 each 0   amLODipine (NORVASC) 10 MG tablet Take 1 tablet (10 mg total) by mouth daily. 30 tablet 2   atorvastatin (LIPITOR) 20 MG tablet Take 0.5 tablets (10 mg total) by mouth daily. 90 tablet 1   metFORMIN (GLUCOPHAGE-XR) 500 MG 24 hr tablet Take 1 tablet (500 mg total) by mouth daily with breakfast. 90 tablet 0   tamsulosin (FLOMAX) 0.4 MG CAPS capsule Take by mouth.     No facility-administered medications prior to visit.    No Known Allergies  ROS Review of Systems  Constitutional: Negative.   HENT: Negative.  Negative for ear pain, postnasal drip, rhinorrhea, sinus pressure, sore throat, trouble swallowing and voice change.   Eyes: Negative.   Respiratory: Negative.  Negative for apnea, cough, choking, chest tightness, shortness of breath, wheezing and stridor.   Cardiovascular: Negative.  Negative for chest pain, palpitations and leg swelling.  Gastrointestinal: Negative.  Negative for abdominal distention, abdominal pain, nausea and vomiting.  Genitourinary: Negative.   Musculoskeletal: Negative.  Negative for arthralgias and myalgias.  Skin: Negative.  Negative for rash.  Allergic/Immunologic: Negative.  Negative for environmental allergies and food allergies.  Neurological: Negative.  Negative for dizziness, syncope, weakness and headaches.  Hematological: Negative.  Negative for adenopathy. Does not bruise/bleed easily.  Psychiatric/Behavioral: Negative.  Negative for agitation and sleep disturbance. The patient is not nervous/anxious.      Objective:     Physical Exam Vitals reviewed.  Constitutional:      Appearance: Normal appearance. He is well-developed. He is not diaphoretic.  HENT:     Head: Normocephalic and atraumatic.     Nose: No nasal deformity, septal deviation, mucosal edema or rhinorrhea.     Right Sinus: No maxillary sinus tenderness or frontal sinus tenderness.     Left Sinus: No maxillary sinus tenderness or frontal sinus tenderness.     Mouth/Throat:     Pharynx: No oropharyngeal exudate.     Comments: Poor dentition with multiple carious teeth Eyes:     General: No scleral icterus.    Conjunctiva/sclera: Conjunctivae normal.     Pupils: Pupils are equal, round, and reactive to light.  Neck:     Thyroid: No thyromegaly.     Vascular: No carotid bruit or JVD.     Trachea: Trachea normal. No tracheal tenderness or tracheal deviation.  Cardiovascular:     Rate and Rhythm: Normal rate and regular rhythm.     Chest Wall: PMI is not displaced.     Pulses: Normal pulses. No decreased pulses.     Heart sounds: Normal heart sounds, S1 normal and S2 normal. Heart sounds not distant. No murmur heard. No systolic murmur is present.  No diastolic murmur is present.    No friction rub. No gallop. No S3 or S4 sounds.  Pulmonary:     Effort: No tachypnea, accessory muscle usage or respiratory distress.     Breath sounds: No stridor. No decreased breath sounds, wheezing, rhonchi or rales.  Chest:     Chest wall: No tenderness.  Abdominal:     General: Bowel sounds are normal. There is no distension.     Palpations: Abdomen is soft. Abdomen is not rigid.     Tenderness: There is no abdominal tenderness. There is no guarding or rebound.  Musculoskeletal:        General: Normal range of motion.     Cervical back: Normal range of motion and neck supple. No edema, erythema or rigidity. No muscular tenderness. Normal range of motion.     Comments: Foot exam is normal  Lymphadenopathy:     Head:     Right side of head: No  submental or submandibular adenopathy.     Left side of head: No submental or submandibular adenopathy.     Cervical: No cervical adenopathy.  Skin:    General: Skin is warm and dry.     Coloration: Skin is not pale.     Findings: No rash.     Nails: There is no clubbing.  Neurological:     Mental Status: He is alert and oriented to person, place, and time.     Sensory: No sensory deficit.  Psychiatric:        Speech: Speech normal.        Behavior: Behavior normal.    BP 139/85  Pulse 65   Resp 16   Wt 178 lb 12.8 oz (81.1 kg)   SpO2 98%   BMI 28.00 kg/m  Wt Readings from Last 3 Encounters:  11/28/20 178 lb 12.8 oz (81.1 kg)  06/01/18 185 lb (83.9 kg)     Health Maintenance Due  Topic Date Due   Pneumococcal Vaccine 28-16 Years old (1 - PCV) Never done   OPHTHALMOLOGY EXAM  Never done   Fecal DNA (Cologuard)  Never done   Zoster Vaccines- Shingrix (1 of 2) Never done   COVID-19 Vaccine (2 - Pfizer series) 12/02/2019    There are no preventive care reminders to display for this patient.  No results found for: TSH Lab Results  Component Value Date   WBC 6.5 09/12/2020   HGB 15.4 09/12/2020   HCT 45.4 09/12/2020   MCV 85 09/12/2020   PLT 273 09/12/2020   Lab Results  Component Value Date   NA 141 09/12/2020   K 4.7 09/12/2020   CO2 27 09/12/2020   GLUCOSE 118 (H) 09/12/2020   BUN 8 09/12/2020   CREATININE 0.88 09/12/2020   BILITOT 0.6 09/12/2020   ALKPHOS 77 09/12/2020   AST 16 09/12/2020   ALT 11 09/12/2020   PROT 7.4 09/12/2020   ALBUMIN 4.8 09/12/2020   CALCIUM 9.6 09/12/2020   ANIONGAP 6 11/30/2019   EGFR 100 09/12/2020   Lab Results  Component Value Date   CHOL 120 09/12/2020   Lab Results  Component Value Date   HDL 49 09/12/2020   Lab Results  Component Value Date   LDLCALC 60 09/12/2020   Lab Results  Component Value Date   TRIG 44 09/12/2020   Lab Results  Component Value Date   CHOLHDL 2.4 09/12/2020   Lab Results   Component Value Date   HGBA1C 6.0 (H) 09/12/2020      Assessment & Plan:   Problem List Items Addressed This Visit       Cardiovascular and Mediastinum   Essential hypertension    Blood pressure at goal continue amlodipine 10 mg daily advised as to the use of a Dash diet      Relevant Medications   amLODipine (NORVASC) 10 MG tablet   atorvastatin (LIPITOR) 10 MG tablet     Endocrine   Type 2 diabetes mellitus without complication, without long-term current use of insulin (HCC) - Primary    Type 2 diabetes well controlled hemoglobin A1c at goal continue metformin daily and atorvastatin      Relevant Medications   metFORMIN (GLUCOPHAGE-XR) 500 MG 24 hr tablet   atorvastatin (LIPITOR) 10 MG tablet     Genitourinary   Benign non-nodular prostatic hyperplasia with lower urinary tract symptoms    No issues with bladder continue tamsulosin      Relevant Medications   tamsulosin (FLOMAX) 0.4 MG CAPS capsule     Other   RESOLVED: Chronic left-sided low back pain without sciatica    This has resolved with over-the-counter ibuprofen      Other Visit Diagnoses     Colon cancer screening       Relevant Orders   Cologuard   Need for immunization against influenza       Relevant Orders   Flu Vaccine QUAD 43moIM (Fluarix, Fluzone & Alfiuria Quad PF) (Completed)       Meds ordered this encounter  Medications   amLODipine (NORVASC) 10 MG tablet    Sig: Take 1 tablet (10 mg total) by mouth daily.  Dispense:  90 tablet    Refill:  2   metFORMIN (GLUCOPHAGE-XR) 500 MG 24 hr tablet    Sig: Take 1 tablet (500 mg total) by mouth daily with breakfast.    Dispense:  90 tablet    Refill:  0   tamsulosin (FLOMAX) 0.4 MG CAPS capsule    Sig: Take 1 capsule (0.4 mg total) by mouth daily.    Dispense:  60 capsule    Refill:  4   atorvastatin (LIPITOR) 10 MG tablet    Sig: Take 1 tablet (10 mg total) by mouth daily.    Dispense:  90 tablet    Refill:  3  Colon cancer  screening will be achieved with Cologuard  Follow-up: Return in about 4 months (around 03/28/2021).    Asencion Noble, MD

## 2020-11-28 NOTE — Patient Instructions (Signed)
Flu vaccine was given  Follow low-salt diet see attached diet  All medication refill sent to your [facility] in [facility]  Please obtain an eye exam see resource sheet  Please obtain a dental exam see resource sheet  Return to see Dr. Delford Field 4 months  ????? ???? ??????  ????-?? ?????? ???? ? ??????? ????? ?????  ??? ?????? ???? ????? ??? ?? ????? [facility] ?????  ????? ???? ???? ????? ?????  ????? ???? ???? ????? ??? ???? ???? ?????  ???? ???? ???? ???? 4 ??? yegunifani kitibati teset'itwali?  zik'itenya-ch'ewi ?megagebini yiketelu ? yeteyayazewini ?megagebi yimeliketu  hulumi yemedih?n?ti memulati bekefitenya net'ibi wede ye'irisiwo [facility] telikwali?  ibakiwoni ye'?yini mirimera werek'etuni yimeliketu  ibakiwoni yet'irisi hikimina mirimerani yaginyu yemereja werek'eti yimeliketu  dokiteri rayitini lemayeti temelesi 4 werati

## 2020-11-28 NOTE — Assessment & Plan Note (Signed)
Blood pressure at goal continue amlodipine 10 mg daily advised as to the use of a [employer]

## 2020-12-15 LAB — COLOGUARD: COLOGUARD: NEGATIVE

## 2020-12-18 ENCOUNTER — Telehealth: Payer: Self-pay

## 2020-12-18 NOTE — Telephone Encounter (Signed)
Pt was called and vm was left, Information has been sent to nurse pool.    Interpreter:Memah Number: wasn't given

## 2020-12-18 NOTE — Telephone Encounter (Signed)
-----   Message from Storm Frisk, MD sent at 12/16/2020  6:56 AM EST ----- Let pt know cologuard NEG rechk in three yrs

## 2021-03-27 ENCOUNTER — Other Ambulatory Visit: Payer: Self-pay | Admitting: Critical Care Medicine

## 2021-04-02 ENCOUNTER — Ambulatory Visit: Payer: Medicaid Other | Admitting: Critical Care Medicine

## 2021-04-11 ENCOUNTER — Ambulatory Visit: Payer: Medicaid Other | Attending: Critical Care Medicine | Admitting: Critical Care Medicine

## 2021-04-11 ENCOUNTER — Other Ambulatory Visit: Payer: Self-pay

## 2021-04-11 ENCOUNTER — Encounter: Payer: Self-pay | Admitting: Critical Care Medicine

## 2021-04-11 VITALS — BP 133/73 | HR 67 | Wt 173.6 lb

## 2021-04-11 DIAGNOSIS — H6123 Impacted cerumen, bilateral: Secondary | ICD-10-CM

## 2021-04-11 DIAGNOSIS — K029 Dental caries, unspecified: Secondary | ICD-10-CM

## 2021-04-11 DIAGNOSIS — H6121 Impacted cerumen, right ear: Secondary | ICD-10-CM | POA: Diagnosis not present

## 2021-04-11 DIAGNOSIS — E119 Type 2 diabetes mellitus without complications: Secondary | ICD-10-CM

## 2021-04-11 DIAGNOSIS — H6122 Impacted cerumen, left ear: Secondary | ICD-10-CM | POA: Diagnosis not present

## 2021-04-11 DIAGNOSIS — I10 Essential (primary) hypertension: Secondary | ICD-10-CM | POA: Diagnosis not present

## 2021-04-11 DIAGNOSIS — N401 Enlarged prostate with lower urinary tract symptoms: Secondary | ICD-10-CM

## 2021-04-11 DIAGNOSIS — H6120 Impacted cerumen, unspecified ear: Secondary | ICD-10-CM | POA: Insufficient documentation

## 2021-04-11 LAB — POCT GLYCOSYLATED HEMOGLOBIN (HGB A1C): HbA1c, POC (controlled diabetic range): 6.1 % (ref 0.0–7.0)

## 2021-04-11 LAB — GLUCOSE, POCT (MANUAL RESULT ENTRY): POC Glucose: 120 mg/dl — AB (ref 70–99)

## 2021-04-11 MED ORDER — METFORMIN HCL ER 500 MG PO TB24
ORAL_TABLET | ORAL | 2 refills | Status: DC
  Filled 2021-04-11 – 2021-07-10 (×3): qty 90, 90d supply, fill #0

## 2021-04-11 MED ORDER — DEBROX 6.5 % OT SOLN
5.0000 [drp] | Freq: Two times a day (BID) | OTIC | 1 refills | Status: DC
  Filled 2021-04-11: qty 15, 30d supply, fill #0

## 2021-04-11 MED ORDER — AMLODIPINE BESYLATE 10 MG PO TABS
10.0000 mg | ORAL_TABLET | Freq: Every day | ORAL | 2 refills | Status: DC
  Filled 2021-04-11 – 2021-06-01 (×5): qty 90, 90d supply, fill #0

## 2021-04-11 MED ORDER — ATORVASTATIN CALCIUM 10 MG PO TABS
10.0000 mg | ORAL_TABLET | Freq: Every day | ORAL | 2 refills | Status: DC
  Filled 2021-04-11 – 2021-07-10 (×2): qty 90, 90d supply, fill #0

## 2021-04-11 MED ORDER — TAMSULOSIN HCL 0.4 MG PO CAPS
0.4000 mg | ORAL_CAPSULE | Freq: Every day | ORAL | 4 refills | Status: DC
  Filled 2021-04-11: qty 90, 90d supply, fill #0
  Filled 2021-07-10: qty 90, 90d supply, fill #1

## 2021-04-11 NOTE — Progress Notes (Signed)
*-- ? ?Established Patient Office Visit ? ?Subjective:  ?Patient ID: Corey Mason, male    DOB: 01/16/1961  Age: 61 y.o. MRN: [id number] ? ?CC:  ?Chief Complaint  ?Patient presents with  ? Diabetes  ? Cerumen Impaction  ? ? ?HPI  ?11/28/20 ?this visit was assisted by video interpreter rehab [phone] for Amharic language ?Corey Mason presents for primary care follow-up visit.  Patient has history of hypertension and type 2 diabetes and on arrival blood pressure is 139/85 on amlodipine 10 mg daily.  He has been eating excess salt in his diet.  Patient is taking atorvastatin 20 mg daily he is supposed to be on 10 mg daily.  His last cholesterol screening showed he was at goal at a 10 mg daily dosing of atorvastatin. ? ?Patient's low back pain has improved.  He has no other real complaints.  He does have dental issues and would like a referral to a dentist he also needs diabetic eye screening and will need a referral. ? ?Patient is trying to follow a low carbohydrate diet and he is on metformin daily.  His last A1c was 6.0 in August he does not need repeat at this time.  He does agree to receive flu vaccine. ? ?04/11/2021 this visit was assisted by video interpreter amheric speaking #[phone] [name] ? ?Patient seen in return follow-up from a visit in November.  Patient has type 2 diabetes and hypertension.  On arrival blood sugar 120 A1c 6.1 blood pressure 133/73.  Patient remains compliant with his amlodipine atorvastatin and metformin.  He does complain however of right ear decreased hearing and some discomfort. ?The patient has no other associated complaints ?  ?Past Medical History:  ?Diagnosis Date  ? Hypertension   ? Migraines   ? Urinary retention   ? ? ?History reviewed. No pertinent surgical history. ? ?History reviewed. No pertinent family history. ? ?Social History  ? ?Socioeconomic History  ? Marital status: Married  ?  Spouse name: Not on file  ? Number of children: Not on file  ? Years of education:  Not on file  ? Highest education level: Not on file  ?Occupational History  ? Not on file  ?Tobacco Use  ? Smoking status: Never  ? Smokeless tobacco: Never  ?Substance and Sexual Activity  ? Alcohol use: Not Currently  ? Drug use: Never  ? Sexual activity: Not Currently  ?Other Topics Concern  ? Not on file  ?Social History Narrative  ? ** Merged History Encounter **  ?    ? ?Social Determinants of Health  ? ?Financial Resource Strain: Not on file  ?Food Insecurity: Not on file  ?Transportation Needs: Not on file  ?Physical Activity: Not on file  ?Stress: Not on file  ?Social Connections: Not on file  ?Intimate Partner Violence: Not on file  ? ? ?Outpatient Medications Prior to Visit  ?Medication Sig Dispense Refill  ? blood glucose meter kit and supplies Use daily to test blood sugar Dispense based on patient and insurance preference. Use up to four times daily as directed. (FOR ICD-10 E10.9, E11.9). 1 each 0  ? atorvastatin (LIPITOR) 10 MG tablet Take 10 mg by mouth daily.    ? metFORMIN (GLUCOPHAGE-XR) 500 MG 24 hr tablet TAKE 1 TABLET(500 MG) BY MOUTH DAILY WITH BREAKFAST 90 tablet 0  ? tamsulosin (FLOMAX) 0.4 MG CAPS capsule Take 1 capsule (0.4 mg total) by mouth daily. 60 capsule 4  ? amLODipine (NORVASC) 10 MG tablet Take 1  tablet (10 mg total) by mouth daily. 90 tablet 2  ? atorvastatin (LIPITOR) 10 MG tablet Take 1 tablet (10 mg total) by mouth daily. 90 tablet 3  ? atorvastatin (LIPITOR) 20 MG tablet Take 10 mg by mouth daily.    ? ?No facility-administered medications prior to visit.  ? ? ?No Known Allergies ? ?ROS ?Review of Systems  ?Constitutional: Negative.   ?HENT:  Positive for ear discharge and hearing loss. Negative for ear pain, postnasal drip, rhinorrhea, sinus pressure, sinus pain, sneezing, sore throat, tinnitus, trouble swallowing and voice change.   ?Eyes: Negative.   ?Respiratory: Negative.  Negative for apnea, cough, choking, chest tightness, shortness of breath, wheezing and stridor.    ?Cardiovascular: Negative.  Negative for chest pain, palpitations and leg swelling.  ?Gastrointestinal: Negative.  Negative for abdominal distention, abdominal pain, nausea and vomiting.  ?Genitourinary: Negative.   ?Musculoskeletal: Negative.  Negative for arthralgias and myalgias.  ?Skin: Negative.  Negative for rash.  ?Allergic/Immunologic: Negative.  Negative for environmental allergies and food allergies.  ?Neurological: Negative.  Negative for dizziness, syncope, weakness and headaches.  ?Hematological: Negative.  Negative for adenopathy. Does not bruise/bleed easily.  ?Psychiatric/Behavioral: Negative.  Negative for agitation and sleep disturbance. The patient is not nervous/anxious.   ? ?  ?Objective:  ?  ?Physical Exam ?Vitals reviewed.  ?Constitutional:   ?   Appearance: Normal appearance. He is well-developed. He is not diaphoretic.  ?HENT:  ?   Head: Normocephalic and atraumatic.  ?   Right Ear: There is impacted cerumen.  ?   Left Ear: There is impacted cerumen.  ?   Nose: Nose normal. No nasal deformity, septal deviation, mucosal edema or rhinorrhea.  ?   Right Sinus: No maxillary sinus tenderness or frontal sinus tenderness.  ?   Left Sinus: No maxillary sinus tenderness or frontal sinus tenderness.  ?   Mouth/Throat:  ?   Mouth: Mucous membranes are moist.  ?   Pharynx: Oropharynx is clear. No oropharyngeal exudate.  ?   Comments: Poor dentition with multiple carious teeth ?Eyes:  ?   General: No scleral icterus. ?   Conjunctiva/sclera: Conjunctivae normal.  ?   Pupils: Pupils are equal, round, and reactive to light.  ?Neck:  ?   Thyroid: No thyromegaly.  ?   Vascular: No carotid bruit or JVD.  ?   Trachea: Trachea normal. No tracheal tenderness or tracheal deviation.  ?Cardiovascular:  ?   Rate and Rhythm: Normal rate and regular rhythm.  ?   Chest Wall: PMI is not displaced.  ?   Pulses: Normal pulses. No decreased pulses.  ?   Heart sounds: Normal heart sounds, S1 normal and S2 normal. Heart  sounds not distant. No murmur heard. ?No systolic murmur is present.  ?No diastolic murmur is present.  ?  No friction rub. No gallop. No S3 or S4 sounds.  ?Pulmonary:  ?   Effort: No tachypnea, accessory muscle usage or respiratory distress.  ?   Breath sounds: No stridor. No decreased breath sounds, wheezing, rhonchi or rales.  ?Chest:  ?   Chest wall: No tenderness.  ?Abdominal:  ?   General: Bowel sounds are normal. There is no distension.  ?   Palpations: Abdomen is soft. Abdomen is not rigid.  ?   Tenderness: There is no abdominal tenderness. There is no guarding or rebound.  ?Musculoskeletal:     ?   General: Normal range of motion.  ?   Cervical back: Normal range  of motion and neck supple. No edema, erythema or rigidity. No muscular tenderness. Normal range of motion.  ?   Comments: Foot exam is normal  ?Lymphadenopathy:  ?   Head:  ?   Right side of head: No submental or submandibular adenopathy.  ?   Left side of head: No submental or submandibular adenopathy.  ?   Cervical: No cervical adenopathy.  ?Skin: ?   General: Skin is warm and dry.  ?   Coloration: Skin is not pale.  ?   Findings: No rash.  ?   Nails: There is no clubbing.  ?Neurological:  ?   Mental Status: He is alert and oriented to person, place, and time.  ?   Sensory: No sensory deficit.  ?Psychiatric:     ?   Mood and Affect: Mood normal.     ?   Speech: Speech normal.     ?   Behavior: Behavior normal.     ?   Thought Content: Thought content normal.     ?   Judgment: Judgment normal.  ?Bilateral ear lavage performed by nursing I checked the ears there is marked improvement in both areas both ears ? ?BP 133/73   Pulse 67   Wt 173 lb 9.6 oz (78.7 kg)   SpO2 99%   BMI 27.19 kg/m?  ?Wt Readings from Last 3 Encounters:  ?04/11/21 173 lb 9.6 oz (78.7 kg)  ?11/28/20 178 lb 12.8 oz (81.1 kg)  ?06/01/18 185 lb (83.9 kg)  ? ? ? ?Health Maintenance Due  ?Topic Date Due  ? Zoster Vaccines- Shingrix (1 of 2) Never done  ? COVID-19 Vaccine (2 -  Pfizer series) 12/02/2019  ? ? ?There are no preventive care reminders to display for this patient. ? ?No results found for: TSH ?Lab Results  ?Component Value Date  ? WBC 6.5 09/12/2020  ? HGB 15.4 09/12/2020  ?

## 2021-04-11 NOTE — Assessment & Plan Note (Signed)
Patient with dental caries I gave patient a list of Medicaid dentists ?

## 2021-04-11 NOTE — Assessment & Plan Note (Signed)
Patient urinating freely with Flomax we will refill Flomax ?

## 2021-04-11 NOTE — Assessment & Plan Note (Signed)
Congratulated patient on being at goal with his diabetes A1c is 6.1 continue metformin ?

## 2021-04-11 NOTE — Assessment & Plan Note (Signed)
Patient with bilateral cerumen impaction right worse than left ? ?We lavaged both ears were able to get most of the wax out of the right ear and left ear ? ?Patient will receive Debrox for further treatments and we will see the patient back in follow-up ?

## 2021-04-11 NOTE — Patient Instructions (Addendum)
Please obtain a dental referral see handout we gave you ? ?We washed out your ears and use eardrops as directed ? ?Refills on all the medications sent in all medicines were sent to our pharmacy downstairs ? ?Continue to follow a healthy diet as we discussed ? ?Return to see Dr. Delford Field 4 months ? ?????? ??????? ???? ???? ????? ??? ??? ??? ? ???????? ????? ???? ??? ?????? ???????? ? ????? ?????? ??? ?????? ?????? ??? ???? ?? ?? ?? ??????? ???? ? ??????????? ??? ????? ?????? ???? ? ????? ???? ???? ???? 4 ??? ?ibakiwoni yeset?eniwotini yet?irisi r?ferali yimeliketu ye?iji ts?ih?ufi yaginyu ? ?inidetazezini jorowotini tat?ibeni yejoro t?ebitawochini init?ek?emaleni. ? ?behulumi medihan?tochi wisit?i yetelakutini medihan?tochi bemulu memulati wede tachi wede farimas?yachini telikwali ? ?inidetenegagerinewi t??nama ?megagebi meketeliwoni yik?et?ilu ? ?dokiteri rayitini lemayeti temelesi 4 werati ?

## 2021-04-11 NOTE — Assessment & Plan Note (Signed)
Blood pressure well controlled ? ?Refill amlodipine ?

## 2021-04-12 ENCOUNTER — Other Ambulatory Visit: Payer: Self-pay | Admitting: Critical Care Medicine

## 2021-04-21 ENCOUNTER — Other Ambulatory Visit: Payer: Self-pay

## 2021-04-21 ENCOUNTER — Encounter (HOSPITAL_COMMUNITY): Payer: Self-pay | Admitting: Emergency Medicine

## 2021-04-21 ENCOUNTER — Emergency Department (HOSPITAL_COMMUNITY)
Admission: EM | Admit: 2021-04-21 | Discharge: 2021-04-21 | Disposition: A | Discharge status: Home / Self Care | Payer: Medicaid Other | Attending: Emergency Medicine | Admitting: Emergency Medicine

## 2021-04-21 DIAGNOSIS — H6121 Impacted cerumen, right ear: Secondary | ICD-10-CM | POA: Diagnosis not present

## 2021-04-21 DIAGNOSIS — H9201 Otalgia, right ear: Secondary | ICD-10-CM | POA: Diagnosis present

## 2021-04-21 DIAGNOSIS — Z79899 Other long term (current) drug therapy: Secondary | ICD-10-CM | POA: Insufficient documentation

## 2021-04-21 DIAGNOSIS — I10 Essential (primary) hypertension: Secondary | ICD-10-CM | POA: Diagnosis not present

## 2021-04-21 MED ORDER — IBUPROFEN 200 MG PO TABS
600.0000 mg | ORAL_TABLET | Freq: Once | ORAL | Status: AC
  Administered 2021-04-21: 600 mg via ORAL
  Filled 2021-04-21: qty 1

## 2021-04-21 NOTE — ED Triage Notes (Signed)
Pt arrives c/o left ear pain. Pt went to PCP for decreased hearing in L ear, given ear drops from PCP, pain started after taking medication 3 days ago. A/ox4. Pt appears in NAD.  ?

## 2021-04-21 NOTE — ED Notes (Signed)
Pt verbalizes understanding of discharge instructions. Opportunity for questions and answers were provided. Pt discharged from the ED.   ?

## 2021-04-21 NOTE — Discharge Instructions (Addendum)
You were seen in the emergency department for ear pain. ? ?As we discussed, you still had a lot of wax in your right ear. We were unable to remove it. Because you tried the ear drops and they did not work, I would like to send you to the Ear/Nose/Throat doctor to have them remove the rest.  ? ?I have sent a referral for you, but I have also attached their phone number for you to give them a call if you don't hear from them by Tuesday. ? ?You can take ibuprofen or tylenol for pain.  ?_____________________________________________________________ ? ???? ??? ?????? ??? ??? ?????. ? ???????????, ??? ??? ??? ???? ?? ?? ?????. ??????? ??????? ??? ?????? ???? ?????? ????? ???????? ?? ?? / ???? / ???? ??? ???? ??????? ? ????? ?????? ?? ??? ???? ??? ????? ?????????? ?????? ??????? ? ?????, ibuprofen ??? tylenol ???? ???? ?

## 2021-04-21 NOTE — ED Provider Notes (Signed)
?[facility] ?Provider Note ? ? ?CSN: [id number] ?Arrival date & time: 04/21/21  1253 ? ?  ? ?History ? ?Chief Complaint  ?Patient presents with  ? Otalgia  ? ? ?Corey Mason is a 61 y.o. male who presents emergency department complaining of left ear pain.  Patient states that he initially went to his primary doctor and complained of decreased hearing in his left ear, he had his ears lavaged and was given eardrops.  He states that his pain started after taking the medication 3 days ago. ? ? ?Otalgia ?Associated symptoms: hearing loss   ?Associated symptoms: no congestion, no cough, no ear discharge and no fever   ? ?  ? ?Home Medications ?Prior to Admission medications   ?Medication Sig Start Date End Date Taking? Authorizing Provider  ?amLODipine (NORVASC) 10 MG tablet Take 1 tablet (10 mg total) by mouth daily. 04/11/21 07/10/21  Elsie Stain, MD  ?atorvastatin (LIPITOR) 10 MG tablet Take 1 tablet (10 mg total) by mouth daily. 04/11/21   Elsie Stain, MD  ?blood glucose meter kit and supplies Use daily to test blood sugar Dispense based on patient and insurance preference. Use up to four times daily as directed. (FOR ICD-10 E10.9, E11.9). 09/12/20   Elsie Stain, MD  ?carbamide peroxide (DEBROX) 6.5 % OTIC solution Place 5 drops into both ears 2 (two) times daily. 04/11/21   Elsie Stain, MD  ?metFORMIN (GLUCOPHAGE-XR) 500 MG 24 hr tablet TAKE 1 TABLET(500 MG) BY MOUTH DAILY WITH BREAKFAST 04/11/21   Elsie Stain, MD  ?tamsulosin (FLOMAX) 0.4 MG CAPS capsule Take 1 capsule (0.4 mg total) by mouth daily. 04/11/21   Elsie Stain, MD  ?   ? ?Allergies    ?Patient has no known allergies.   ? ?Review of Systems   ?Review of Systems  ?Constitutional:  Negative for chills and fever.  ?HENT:  Positive for ear pain and hearing loss. Negative for congestion and ear discharge.   ?Respiratory:  Negative for cough.   ?All other systems reviewed and are  negative. ? ?Physical Exam ?Updated Vital Signs ?BP 123/75   Pulse (!) 53   Temp 98.6 ?F (37 ?C) (Oral)   Resp 16   SpO2 98%  ?Physical Exam ?Vitals and nursing note reviewed.  ?Constitutional:   ?   Appearance: Normal appearance.  ?HENT:  ?   Head: Normocephalic and atraumatic.  ?   Right Ear: External ear normal. Decreased hearing noted. There is impacted cerumen.  ?   Left Ear: Hearing, tympanic membrane, ear canal and external ear normal.  ?Eyes:  ?   Conjunctiva/sclera: Conjunctivae normal.  ?Pulmonary:  ?   Effort: Pulmonary effort is normal. No respiratory distress.  ?Skin: ?   General: Skin is warm and dry.  ?Neurological:  ?   Mental Status: He is alert.  ?   Comments: Neuro: Speech is clear, able to follow commands. CN III-XII intact grossly intact. PERRLA. EOMI. Sensation intact throughout. Str 5/5 all extremities.  ?Psychiatric:     ?   Mood and Affect: Mood normal.     ?   Behavior: Behavior normal.  ? ? ?ED Results / Procedures / Treatments   ?Labs ?(all labs ordered are listed, but only abnormal results are displayed) ?Labs Reviewed - No data to display ? ?EKG ?None ? ?Radiology ?No results found. ? ?Procedures ?Procedures  ? ? ?Medications Ordered in ED ?Medications  ?ibuprofen (ADVIL) tablet 600 mg (  600 mg Oral Given 04/21/21 1412)  ? ? ?ED Course/ Medical Decision Making/ A&P ?  ?                        ?Medical Decision Making ? ?This patient is a 61 year old male who presents to the ED for concern of ear pain.  ? ?Past Medical History / Co-morbidities / Social History: ?Migraines, HTN ? ?Physical Exam: ?Physical exam performed. The pertinent findings include: Impacted cerumen of right ear. Normal left ear.  ? ?Medications: ?Patient given ibuprofen for ear pain. Right ear lavaged and wax was unable to be removed. ?  ?Disposition: ?After consideration of the diagnostic results and the patients response to treatment, I feel that patient is not requiring admission or inpatient treatment for their  symptoms.  I have discussed with the patient that his symptoms are likely related to wax buildup.  As we were unsuccessful with removing the wax in the emergency department, I have sent a referral to ENT for the patient to follow-up.  As he is afebrile, and has no neurologic deficits, I have low suspicion for other infection such as malignant otitis externa.  Discussed reasons to return to the emergency department, and the patient is agreeable to the plan.  ? ?Final Clinical Impression(s) / ED Diagnoses ?Final diagnoses:  ?Impacted cerumen of right ear  ?Right ear pain  ? ? ?Rx / DC Orders ?ED Discharge Orders   ? ?      Ordered  ?  Ambulatory referral to ENT       ? 04/21/21 1432  ? ?  ?  ? ?  ? ?Portions of this report may have been transcribed using voice recognition software. Every effort was made to ensure accuracy; however, inadvertent computerized transcription errors may be present. ? ?  ?Kateri Plummer, PA-C ?04/21/21 1435 ? ?  ?Lorelle Gibbs, DO ?04/22/21 1140 ? ?

## 2021-04-23 ENCOUNTER — Other Ambulatory Visit: Payer: Self-pay

## 2021-04-23 MED ORDER — IBUPROFEN 800 MG PO TABS
800.0000 mg | ORAL_TABLET | Freq: Four times a day (QID) | ORAL | 0 refills | Status: DC | PRN
  Filled 2021-04-23: qty 21, 6d supply, fill #0

## 2021-05-01 ENCOUNTER — Other Ambulatory Visit: Payer: Self-pay

## 2021-05-03 ENCOUNTER — Other Ambulatory Visit: Payer: Self-pay | Admitting: Critical Care Medicine

## 2021-06-01 ENCOUNTER — Other Ambulatory Visit: Payer: Self-pay

## 2021-06-04 ENCOUNTER — Other Ambulatory Visit: Payer: Self-pay | Admitting: Pharmacist

## 2021-06-04 NOTE — Chronic Care Management (AMB) (Signed)
Patient seen by Park Liter, PharmD Candidate on 06/01/21 while they were picking up prescriptions at [place] at [facility].  ? ?Patient has an automated home blood pressure machine. They report home readings consistently at goal <130/80 at home. ? ?Medication review was performed. They are taking medications as prescribed.  ? ?The following barriers to adherence were noted: ?- Denies concerns with medication access or understanding. ? ?The following interventions were completed:  ?- Patient was educated on medications, including indication and administration ?- Patient was counseled on lifestyle modifications to improve blood pressure ? ?The patient has follow up scheduled:  ?PCP: 08/13/21 ? ? ?Catie Hedwig Morton, PharmD, BCACP ?[facility] ?[phone] ? ?

## 2021-07-10 ENCOUNTER — Other Ambulatory Visit: Payer: Self-pay

## 2021-07-17 ENCOUNTER — Other Ambulatory Visit: Payer: Self-pay | Admitting: Critical Care Medicine

## 2021-08-12 NOTE — Progress Notes (Incomplete)
*--  Established Patient Office Visit  Subjective:  Patient ID: Corey Mason, male    DOB: 08/28/1960  Age: 61 y.o. MRN: [id number]  CC:  No chief complaint on file.   HPI  11/28/20 this visit was assisted by video interpreter rehab #[id number] for Amharic language [name] presents for primary care follow-up visit.  Patient has history of hypertension and type 2 diabetes and on arrival blood pressure is 139/85 on amlodipine 10 mg daily.  He has been eating excess salt in his diet.  Patient is taking atorvastatin 20 mg daily he is supposed to be on 10 mg daily.  His last cholesterol screening showed he was at goal at a 10 mg daily dosing of atorvastatin.  Patient's low back pain has improved.  He has no other real complaints.  He does have dental issues and would like a referral to a dentist he also needs diabetic eye screening and will need a referral.  Patient is trying to follow a low carbohydrate diet and he is on metformin daily.  His last A1c was 6.0 in August he does not need repeat at this time.  He does agree to receive flu vaccine.  03/2021 this visit was assisted by video interpreter amheric speaking #[phone] [name]  Patient seen in return follow-up from a visit in November.  Patient has type 2 diabetes and hypertension.  On arrival blood sugar 120 A1c 6.1 blood pressure 133/73.  Patient remains compliant with his amlodipine atorvastatin and metformin.  He does complain however of right ear decreased hearing and some discomfort. The patient has no other associated complaints   7/17    Cardiovascular and Mediastinum   Essential hypertension    Blood pressure well controlled  Refill amlodipine      Relevant Medications   amLODipine (NORVASC) 10 MG tablet   atorvastatin (LIPITOR) 10 MG tablet     Digestive   Dental caries    Patient with dental caries I gave patient a list of Medicaid dentists        Endocrine   Type 2 diabetes mellitus without  complication, without long-term current use of insulin ([place]) - Primary    Congratulated patient on being at goal with his diabetes A1c is 6.1 continue metformin      Relevant Medications   atorvastatin (LIPITOR) 10 MG tablet   metFORMIN (GLUCOPHAGE-XR) 500 MG 24 hr tablet   Other Relevant Orders   POCT glucose (manual entry) (Completed)   POCT glycosylated hemoglobin (Hb A1C) (Completed)     Nervous and Auditory   Cerumen impaction    Patient with bilateral cerumen impaction right worse than left  We lavaged both ears were able to get most of the wax out of the right ear and left ear  Patient will receive Debrox for further treatments and we will see the patient back in follow-up      Relevant Orders   Ear Lavage     Genitourinary   Benign non-nodular prostatic hyperplasia with lower urinary tract symptoms    Patient urinating freely with Flomax we will refill Flomax      Relevant Medications   tamsulosin (FLOMAX) 0.4 MG CAPS capsule   Past Medical History:  Diagnosis Date  . Hypertension   . Migraines   . Urinary retention     No past surgical history on file.  No family history on file.  Social History   Socioeconomic History  . Marital status: Married    Spouse name:  Not on file  . Number of children: Not on file  . Years of education: Not on file  . Highest education level: Not on file  Occupational History  . Not on file  Tobacco Use  . Smoking status: Never  . Smokeless tobacco: Never  Substance and Sexual Activity  . Alcohol use: Not Currently  . Drug use: Never  . Sexual activity: Not Currently  Other Topics Concern  . Not on file  Social History Narrative   ** Merged History Encounter **       Social Determinants of Health   Financial Resource Strain: Not on file  Food Insecurity: Not on file  Transportation Needs: Not on file  Physical Activity: Not on file  Stress: Not on file  Social Connections: Not on file  Intimate Partner  Violence: Not on file    Outpatient Medications Prior to Visit  Medication Sig Dispense Refill  . amLODipine (NORVASC) 10 MG tablet Take 1 tablet (10 mg total) by mouth daily. 90 tablet 2  . atorvastatin (LIPITOR) 10 MG tablet Take 1 tablet (10 mg total) by mouth once daily. 90 tablet 2  . blood glucose meter kit and supplies Use daily to test blood sugar Dispense based on patient and insurance preference. Use up to four times daily as directed. (FOR ICD-10 E10.9, E11.9). 1 each 0  . carbamide peroxide (DEBROX) 6.5 % OTIC solution Place 5 drops into both ears 2 (two) times daily. 15 mL 1  . ibuprofen (ADVIL) 800 MG tablet Take 1 tablet (800 mg total) by mouth every 6 (six) hours as needed for pain 21 tablet 0  . metFORMIN (GLUCOPHAGE-XR) 500 MG 24 hr tablet TAKE 1 TABLET(500 MG) BY MOUTH DAILY WITH BREAKFAST 90 tablet 0  . tamsulosin (FLOMAX) 0.4 MG CAPS capsule Take 1 capsule (0.4 mg total) by mouth daily. 60 capsule 4   No facility-administered medications prior to visit.    No Known Allergies  ROS Review of Systems  Constitutional: Negative.   HENT:  Positive for ear discharge and hearing loss. Negative for ear pain, postnasal drip, rhinorrhea, sinus pressure, sinus pain, sneezing, sore throat, tinnitus, trouble swallowing and voice change.   Eyes: Negative.   Respiratory: Negative.  Negative for apnea, cough, choking, chest tightness, shortness of breath, wheezing and stridor.   Cardiovascular: Negative.  Negative for chest pain, palpitations and leg swelling.  Gastrointestinal: Negative.  Negative for abdominal distention, abdominal pain, nausea and vomiting.  Genitourinary: Negative.   Musculoskeletal: Negative.  Negative for arthralgias and myalgias.  Skin: Negative.  Negative for rash.  Allergic/Immunologic: Negative.  Negative for environmental allergies and food allergies.  Neurological: Negative.  Negative for dizziness, syncope, weakness and headaches.  Hematological:  Negative.  Negative for adenopathy. Does not bruise/bleed easily.  Psychiatric/Behavioral: Negative.  Negative for agitation and sleep disturbance. The patient is not nervous/anxious.       Objective:    Physical Exam Vitals reviewed.  Constitutional:      Appearance: Normal appearance. He is well-developed. He is not diaphoretic.  HENT:     Head: Normocephalic and atraumatic.     Right Ear: There is impacted cerumen.     Left Ear: There is impacted cerumen.     Nose: Nose normal. No nasal deformity, septal deviation, mucosal edema or rhinorrhea.     Right Sinus: No maxillary sinus tenderness or frontal sinus tenderness.     Left Sinus: No maxillary sinus tenderness or frontal sinus tenderness.  Mouth/Throat:     Mouth: Mucous membranes are moist.     Pharynx: Oropharynx is clear. No oropharyngeal exudate.     Comments: Poor dentition with multiple carious teeth Eyes:     General: No scleral icterus.    Conjunctiva/sclera: Conjunctivae normal.     Pupils: Pupils are equal, round, and reactive to light.  Neck:     Thyroid: No thyromegaly.     Vascular: No carotid bruit or JVD.     Trachea: Trachea normal. No tracheal tenderness or tracheal deviation.  Cardiovascular:     Rate and Rhythm: Normal rate and regular rhythm.     Chest Wall: PMI is not displaced.     Pulses: Normal pulses. No decreased pulses.     Heart sounds: Normal heart sounds, S1 normal and S2 normal. Heart sounds not distant. No murmur heard.    No systolic murmur is present.     No diastolic murmur is present.     No friction rub. No gallop. No S3 or S4 sounds.  Pulmonary:     Effort: No tachypnea, accessory muscle usage or respiratory distress.     Breath sounds: No stridor. No decreased breath sounds, wheezing, rhonchi or rales.  Chest:     Chest wall: No tenderness.  Abdominal:     General: Bowel sounds are normal. There is no distension.     Palpations: Abdomen is soft. Abdomen is not rigid.      Tenderness: There is no abdominal tenderness. There is no guarding or rebound.  Musculoskeletal:        General: Normal range of motion.     Cervical back: Normal range of motion and neck supple. No edema, erythema or rigidity. No muscular tenderness. Normal range of motion.     Comments: Foot exam is normal  Lymphadenopathy:     Head:     Right side of head: No submental or submandibular adenopathy.     Left side of head: No submental or submandibular adenopathy.     Cervical: No cervical adenopathy.  Skin:    General: Skin is warm and dry.     Coloration: Skin is not pale.     Findings: No rash.     Nails: There is no clubbing.  Neurological:     Mental Status: He is alert and oriented to person, place, and time.     Sensory: No sensory deficit.  Psychiatric:        Mood and Affect: Mood normal.        Speech: Speech normal.        Behavior: Behavior normal.        Thought Content: Thought content normal.        Judgment: Judgment normal.  Bilateral ear lavage performed by nursing I checked the ears there is marked improvement in both areas both ears  There were no vitals taken for this visit. Wt Readings from Last 3 Encounters:  04/11/21 173 lb 9.6 oz (78.7 kg)  11/28/20 178 lb 12.8 oz (81.1 kg)  06/01/18 185 lb (83.9 kg)     Health Maintenance Due  Topic Date Due  . Zoster Vaccines- Shingrix (1 of 2) Never done  . COVID-19 Vaccine (2 - Pfizer series) 01/06/2020  . URINE MICROALBUMIN  09/12/2021    There are no preventive care reminders to display for this patient.  No results found for: "TSH" Lab Results  Component Value Date   WBC 6.5 09/12/2020   HGB 15.4 09/12/2020  HCT 45.4 09/12/2020   MCV 85 09/12/2020   PLT 273 09/12/2020   Lab Results  Component Value Date   NA 141 09/12/2020   K 4.7 09/12/2020   CO2 27 09/12/2020   GLUCOSE 118 (H) 09/12/2020   BUN 8 09/12/2020   CREATININE 0.88 09/12/2020   BILITOT 0.6 09/12/2020   ALKPHOS 77 09/12/2020    AST 16 09/12/2020   ALT 11 09/12/2020   PROT 7.4 09/12/2020   ALBUMIN 4.8 09/12/2020   CALCIUM 9.6 09/12/2020   ANIONGAP 6 11/30/2019   EGFR 100 09/12/2020   Lab Results  Component Value Date   CHOL 120 09/12/2020   Lab Results  Component Value Date   HDL 49 09/12/2020   Lab Results  Component Value Date   LDLCALC 60 09/12/2020   Lab Results  Component Value Date   TRIG 44 09/12/2020   Lab Results  Component Value Date   CHOLHDL 2.4 09/12/2020   Lab Results  Component Value Date   HGBA1C 6.1 04/11/2021      Assessment & Plan:   Problem List Items Addressed This Visit   None No orders of the defined types were placed in this encounter. Colon cancer screening will be achieved with Cologuard  Follow-up: No follow-ups on file.    Asencion Noble, MD

## 2021-08-13 ENCOUNTER — Other Ambulatory Visit: Payer: Self-pay

## 2021-08-13 ENCOUNTER — Encounter: Payer: Self-pay | Admitting: Critical Care Medicine

## 2021-08-13 ENCOUNTER — Ambulatory Visit: Payer: Medicaid Other | Attending: Critical Care Medicine | Admitting: Critical Care Medicine

## 2021-08-13 VITALS — BP 129/74 | HR 70 | Temp 98.0°F | Ht 67.0 in | Wt 171.0 lb

## 2021-08-13 DIAGNOSIS — E119 Type 2 diabetes mellitus without complications: Secondary | ICD-10-CM

## 2021-08-13 DIAGNOSIS — K029 Dental caries, unspecified: Secondary | ICD-10-CM | POA: Diagnosis not present

## 2021-08-13 DIAGNOSIS — N401 Enlarged prostate with lower urinary tract symptoms: Secondary | ICD-10-CM

## 2021-08-13 DIAGNOSIS — I10 Essential (primary) hypertension: Secondary | ICD-10-CM | POA: Diagnosis not present

## 2021-08-13 MED ORDER — METFORMIN HCL ER 500 MG PO TB24: ORAL_TABLET | ORAL | 2 refills | Status: DC

## 2021-08-13 MED ORDER — ATORVASTATIN CALCIUM 10 MG PO TABS: 10.0000 mg | ORAL_TABLET | Freq: Every day | ORAL | 2 refills | Status: DC

## 2021-08-13 MED ORDER — METFORMIN HCL ER 500 MG PO TB24
ORAL_TABLET | ORAL | 2 refills | Status: DC
  Filled 2021-08-13: qty 90, fill #0

## 2021-08-13 MED ORDER — TAMSULOSIN HCL 0.4 MG PO CAPS: 0.4000 mg | ORAL_CAPSULE | Freq: Every day | ORAL | 4 refills | Status: DC

## 2021-08-13 MED ORDER — AMLODIPINE BESYLATE 10 MG PO TABS: 10.0000 mg | ORAL_TABLET | Freq: Every day | ORAL | 2 refills | Status: DC

## 2021-08-13 NOTE — Assessment & Plan Note (Signed)
Doing well with metformin 500 mg daily, recommend he continue.  Continue atorvastatin 10 mg daily  Urine microalbumin, comprehensive met panel  and lipid panel ordered today

## 2021-08-13 NOTE — Progress Notes (Signed)
Established Patient Office Visit  Subjective   Patient ID: Corey Mason, male    DOB: [birth year]  Age: 61 y.o. MRN: [id number]  Chief Complaint  Patient presents with   Diabetes    Corey Mason presents today for follow up on his type 2 diabetes and hypertension. He was last seen in March 2023 with an A1C of 6.1. BP today is 129/74. He does not check his pressures at home but does go to [facility] and states they have been within normal limit.  Following our last visit in march he was seen in the ED due to decreased hearing loss on the right. He then followed up with otolaryngology for cerumen removal.  Reports some lightheadedness recently, but denies pain or room spinning.  Also reports a recent visit to the dentist and now has had some residual pain since.     Patient Active Problem List   Diagnosis Date Noted   Dental caries 04/11/2021   Type 2 diabetes mellitus without complication, without long-term current use of insulin ([place]) 04/02/2018   Essential hypertension 10/23/2017   Benign non-nodular prostatic hyperplasia with lower urinary tract symptoms 07/05/2014   Past Medical History:  Diagnosis Date   Hypertension    Migraines    Urinary retention    History reviewed. No pertinent surgical history. Social History   Tobacco Use   Smoking status: Never   Smokeless tobacco: Never  Substance Use Topics   Alcohol use: Not Currently   Drug use: Never   History reviewed. No pertinent family history. No Known Allergies    Review of Systems  Constitutional:  Negative for chills, diaphoresis, fever, malaise/fatigue and weight loss.  HENT:  Negative for congestion, hearing loss, nosebleeds, sore throat and tinnitus.        Generalized tooth pain   Eyes:  Negative for blurred vision, photophobia and redness.  Respiratory:  Negative for cough, hemoptysis, sputum production, shortness of breath, wheezing and stridor.   Cardiovascular:  Negative for chest pain,  palpitations, orthopnea, claudication, leg swelling and PND.  Gastrointestinal:  Negative for abdominal pain, blood in stool, constipation, diarrhea, heartburn, nausea and vomiting.  Genitourinary:  Negative for dysuria, flank pain, frequency, hematuria and urgency.  Musculoskeletal:  Negative for back pain, falls, joint pain, myalgias and neck pain.  Skin:  Negative for itching and rash.  Neurological:  Positive for dizziness ("lightheaded" no spinning sensation). Negative for tingling, tremors, sensory change, speech change, focal weakness, seizures, loss of consciousness, weakness and headaches.  Endo/Heme/Allergies:  Negative for environmental allergies and polydipsia. Does not bruise/bleed easily.  Psychiatric/Behavioral:  Negative for depression, memory loss, substance abuse and suicidal ideas. The patient is not nervous/anxious and does not have insomnia.   All other systems reviewed and are negative.     Objective:     BP 129/74   Pulse 70   Temp 98 F (36.7 C) (Oral)   Ht '5\' 7"'  (1.702 m)   Wt 171 lb (77.6 kg)   SpO2 97%   BMI 26.78 kg/m     Physical Exam Vitals reviewed.  Constitutional:      Appearance: Normal appearance. He is well-developed. He is not diaphoretic.  HENT:     Head: Normocephalic and atraumatic.     Right Ear: Tympanic membrane, ear canal and external ear normal.     Left Ear: Tympanic membrane, ear canal and external ear normal.     Nose: Nose normal. No nasal deformity, septal deviation, mucosal edema or rhinorrhea.  Right Sinus: No maxillary sinus tenderness or frontal sinus tenderness.     Left Sinus: No maxillary sinus tenderness or frontal sinus tenderness.     Mouth/Throat:     Mouth: Mucous membranes are moist.     Pharynx: Oropharynx is clear. No oropharyngeal exudate.  Eyes:     General: No scleral icterus.    Conjunctiva/sclera: Conjunctivae normal.     Pupils: Pupils are equal, round, and reactive to light.  Neck:     Thyroid: No  thyromegaly.     Vascular: No carotid bruit or JVD.     Trachea: Trachea normal. No tracheal tenderness or tracheal deviation.  Cardiovascular:     Rate and Rhythm: Normal rate and regular rhythm.     Chest Wall: PMI is not displaced.     Pulses: Normal pulses. No decreased pulses.     Heart sounds: Normal heart sounds, S1 normal and S2 normal. Heart sounds not distant. No murmur heard.    No systolic murmur is present.     No diastolic murmur is present.     No friction rub. No gallop. No S3 or S4 sounds.  Pulmonary:     Effort: Pulmonary effort is normal. No tachypnea, accessory muscle usage or respiratory distress.     Breath sounds: No stridor. No decreased breath sounds, wheezing, rhonchi or rales.  Chest:     Chest wall: No tenderness.  Abdominal:     General: Bowel sounds are normal. There is no distension.     Palpations: Abdomen is soft. Abdomen is not rigid.     Tenderness: There is no abdominal tenderness. There is no guarding or rebound.  Musculoskeletal:        General: Normal range of motion.     Cervical back: Normal range of motion and neck supple. No edema, erythema or rigidity. No muscular tenderness. Normal range of motion.  Lymphadenopathy:     Head:     Right side of head: No submental or submandibular adenopathy.     Left side of head: No submental or submandibular adenopathy.     Cervical: No cervical adenopathy.  Skin:    General: Skin is warm and dry.     Coloration: Skin is not pale.     Findings: No rash.     Nails: There is no clubbing.  Neurological:     General: No focal deficit present.     Mental Status: He is alert and oriented to person, place, and time.     Sensory: No sensory deficit.  Psychiatric:        Mood and Affect: Mood normal.        Speech: Speech normal.        Behavior: Behavior normal.      No results found for any visits on 08/13/21.  Last hemoglobin A1c Lab Results  Component Value Date   HGBA1C 6.1 04/11/2021       The ASCVD Risk score (Arnett DK, et al., 2019) failed to calculate for the following reasons:   The valid total cholesterol range is 130 to 320 mg/dL    Assessment & Plan:   Problem List Items Addressed This Visit       Cardiovascular and Mediastinum   Essential hypertension    Well controlled at this time. Continue amlodipine 10 mg daily  Continue Atorvastatin 10 mg daily. There was some confusion regarding taking 1/2 a pill. Patient instructed to take the full pill everyday.  Lipid panel, complete blood  count with differential/platelet and comprehensive met panel ordered      Relevant Medications   amLODipine (NORVASC) 10 MG tablet   atorvastatin (LIPITOR) 10 MG tablet   Other Relevant Orders   CBC with Differential/Platelet     Digestive   Dental caries    Per patient he had recent dental work completed. Recommend he follow up with dentist for any residual discomfort. No signs of infection seen on exam today        Endocrine   Type 2 diabetes mellitus without complication, without long-term current use of insulin ([place]) - Primary    Doing well with metformin 500 mg daily, recommend he continue.  Continue atorvastatin 10 mg daily  Urine microalbumin, comprehensive met panel  and lipid panel ordered today       Relevant Medications   atorvastatin (LIPITOR) 10 MG tablet   metFORMIN (GLUCOPHAGE-XR) 500 MG 24 hr tablet   Other Relevant Orders   Microalbumin / creatinine urine ratio   Comprehensive metabolic panel   Lipid panel     Genitourinary   Benign non-nodular prostatic hyperplasia with lower urinary tract symptoms    Continue tamsulosin daily      Relevant Medications   tamsulosin (FLOMAX) 0.4 MG CAPS capsule    Return in about 5 months (around 01/13/2022).    Asencion Noble, MD

## 2021-08-13 NOTE — Patient Instructions (Addendum)
No change in medications all your refills were sent to the [facility] on [address]  Labs today include metabolic panel, blood counts, cholesterol and urine for microalbumin which is protein in the urine  Return to see Dr. Delford Field 5 months

## 2021-08-13 NOTE — Progress Notes (Signed)
Dizziness.

## 2021-08-13 NOTE — Assessment & Plan Note (Signed)
Continue tamsulosin daily

## 2021-08-13 NOTE — Assessment & Plan Note (Signed)
Well controlled at this time. Continue amlodipine 10 mg daily  Continue Atorvastatin 10 mg daily. There was some confusion regarding taking 1/2 a pill. Patient instructed to take the full pill everyday.  Lipid panel, complete blood count with differential/platelet and comprehensive met panel ordered

## 2021-08-13 NOTE — Assessment & Plan Note (Addendum)
Per patient he had recent dental work completed. Recommend he follow up with dentist for any residual discomfort. No signs of infection seen on exam today

## 2021-08-14 LAB — COMPREHENSIVE METABOLIC PANEL
ALT: 15 IU/L (ref 0–44)
AST: 16 IU/L (ref 0–40)
Albumin/Globulin Ratio: 2 (ref 1.2–2.2)
Albumin: 4.9 g/dL (ref 3.8–4.9)
Alkaline Phosphatase: 63 IU/L (ref 44–121)
BUN/Creatinine Ratio: 15 (ref 9–20)
BUN: 16 mg/dL (ref 6–24)
Bilirubin Total: 0.5 mg/dL (ref 0.0–1.2)
CO2: 25 mmol/L (ref 20–29)
Calcium: 9.4 mg/dL (ref 8.7–10.2)
Chloride: 103 mmol/L (ref 96–106)
Creatinine, Ser: 1.04 mg/dL (ref 0.76–1.27)
Globulin, Total: 2.4 g/dL (ref 1.5–4.5)
Glucose: 113 mg/dL — ABNORMAL HIGH (ref 70–99)
Potassium: 4.3 mmol/L (ref 3.5–5.2)
Sodium: 141 mmol/L (ref 134–144)
Total Protein: 7.3 g/dL (ref 6.0–8.5)
eGFR: 83 mL/min/{1.73_m2} (ref >59)

## 2021-08-14 LAB — CBC WITH DIFFERENTIAL/PLATELET
Basophils Absolute: 0 10*3/uL (ref 0.0–0.2)
Basos: 0 %
EOS (ABSOLUTE): 0.1 10*3/uL (ref 0.0–0.4)
Eos: 1 %
Hematocrit: 43.2 % (ref 37.5–51.0)
Hemoglobin: 15.3 g/dL (ref 13.0–17.7)
Immature Grans (Abs): 0 10*3/uL (ref 0.0–0.1)
Immature Granulocytes: 0 %
Lymphocytes Absolute: 1 10*3/uL (ref 0.7–3.1)
Lymphs: 14 %
MCH: 30.4 pg (ref 26.6–33.0)
MCHC: 35.4 g/dL (ref 31.5–35.7)
MCV: 86 fL (ref 79–97)
Monocytes Absolute: 0.4 10*3/uL (ref 0.1–0.9)
Monocytes: 5 %
Neutrophils Absolute: 5.3 10*3/uL (ref 1.4–7.0)
Neutrophils: 80 %
Platelets: 228 10*3/uL (ref 150–450)
RBC: 5.03 x10E6/uL (ref 4.14–5.80)
RDW: 12 % (ref 11.6–15.4)
WBC: 6.8 10*3/uL (ref 3.4–10.8)

## 2021-08-14 LAB — LIPID PANEL
Chol/HDL Ratio: 2.6 ratio (ref 0.0–5.0)
Cholesterol, Total: 126 mg/dL (ref 100–199)
HDL: 49 mg/dL (ref >39)
LDL Chol Calc (NIH): 68 mg/dL (ref 0–99)
Triglycerides: 35 mg/dL (ref 0–149)
VLDL Cholesterol Cal: 9 mg/dL (ref 5–40)

## 2021-08-14 LAB — MICROALBUMIN / CREATININE URINE RATIO
Creatinine, Urine: 267.4 mg/dL
Microalb/Creat Ratio: 4 mg/g creat (ref 0–29)
Microalbumin, Urine: 10.8 ug/mL

## 2021-08-14 NOTE — Progress Notes (Signed)
Let pt know cholesterol and urine for protein normal   blood count kidney liver normal  No medication changes, stay on atorvastatin one 10mg  daily  no change in metformin dose

## 2021-08-17 ENCOUNTER — Telehealth: Payer: Self-pay

## 2021-08-17 NOTE — Telephone Encounter (Signed)
-----   Message from Storm Frisk, MD sent at 08/14/2021  2:10 PM EDT ----- Let pt know cholesterol and urine for protein normal   blood count kidney liver normal  No medication changes, stay on atorvastatin one 10mg  daily  no change in metformin dose

## 2021-08-17 NOTE — Telephone Encounter (Signed)
Pt was called and no vm was left due to mailbox being full. Information has been sent to nurse pool.       Interpreter [phone]

## 2021-12-07 ENCOUNTER — Encounter (HOSPITAL_COMMUNITY): Payer: Self-pay

## 2021-12-07 ENCOUNTER — Ambulatory Visit (HOSPITAL_COMMUNITY)
Admission: EM | Admit: 2021-12-07 | Discharge: 2021-12-07 | Disposition: A | Discharge status: Home / Self Care | Payer: Medicaid Other | Attending: Family Medicine | Admitting: Family Medicine

## 2021-12-07 DIAGNOSIS — E119 Type 2 diabetes mellitus without complications: Secondary | ICD-10-CM

## 2021-12-07 LAB — HEMOGLOBIN A1C
Hgb A1c MFr Bld: 5.5 % (ref 4.8–5.6)
Mean Plasma Glucose: 111.15 mg/dL

## 2021-12-07 NOTE — ED Triage Notes (Signed)
Patient states he needs a medical certification from a doctor because he is a [employer]. They told him he need more clarification on A1c and blood pressure.

## 2021-12-07 NOTE — ED Provider Notes (Signed)
[facility]    CSN: [id number] Arrival date & time: 12/07/21  1444      History   Chief Complaint Chief Complaint  Patient presents with   a1C    HPI Gedeon Brandow is a 61 y.o. male.   Interpreter used today.  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  He needs an updated A1c in order to be cleared for this.  He is here today for an A1c.  His last A1c was 08/2020, 6.0.  Last blood glucose 07/2021 was 113.  He denies increased thirst or urination.  He is a diabetic and taking his medication as prescribed.  Blood pressure is good today.   No complaints.        Past Medical History:  Diagnosis Date   Hypertension    Migraines    Urinary retention     Patient Active Problem List   Diagnosis Date Noted   Dental caries 04/11/2021   Type 2 diabetes mellitus without complication, without long-term current use of insulin ([place]) 04/02/2018   Essential hypertension 10/23/2017   Benign non-nodular prostatic hyperplasia with lower urinary tract symptoms 07/05/2014    History reviewed. No pertinent surgical history.     Home Medications    Prior to Admission medications   Medication Sig Start Date End Date Taking? Authorizing Provider  amLODipine (NORVASC) 10 MG tablet Take 1 tablet (10 mg total) by mouth daily. 08/13/21 11/11/21  Elsie Stain, MD  atorvastatin (LIPITOR) 10 MG tablet Take 1 tablet (10 mg total) by mouth once daily. 08/13/21   Elsie Stain, MD  blood glucose meter kit and supplies Use daily to test blood sugar Dispense based on patient and insurance preference. Use up to four times daily as directed. (FOR ICD-10 E10.9, E11.9). 09/12/20   Elsie Stain, MD  metFORMIN (GLUCOPHAGE-XR) 500 MG 24 hr tablet TAKE 1 TABLET(500 MG) BY MOUTH DAILY WITH BREAKFAST 08/13/21   Elsie Stain, MD  tamsulosin (FLOMAX) 0.4 MG CAPS capsule Take 1 capsule (0.4 mg total) by mouth daily. 08/13/21   Elsie Stain, MD    Family History History reviewed.  No pertinent family history.  Social History Social History   Tobacco Use   Smoking status: Never   Smokeless tobacco: Never  Substance Use Topics   Alcohol use: Not Currently   Drug use: Never     Allergies   Patient has no known allergies.   Review of Systems Review of Systems  Constitutional: Negative.   HENT: Negative.    Respiratory: Negative.    Cardiovascular: Negative.   Gastrointestinal: Negative.   Musculoskeletal: Negative.   Psychiatric/Behavioral: Negative.       Physical Exam Triage Vital Signs ED Triage Vitals  Enc Vitals Group     BP 12/07/21 1505 136/77     Pulse Rate 12/07/21 1505 75     Resp 12/07/21 1505 18     Temp 12/07/21 1502 98.9 F (37.2 C)     Temp Source 12/07/21 1502 Oral     SpO2 12/07/21 1505 98 %     Weight --      Height --      Head Circumference --      Peak Flow --      Pain Score 12/07/21 1502 0     Pain Loc --      Pain Edu? --      Excl. in [place]? --    No data found.  Updated Vital Signs  BP 136/77 (BP Location: Left Arm)   Pulse 75   Temp 98.9 F (37.2 C) (Oral)   Resp 18   SpO2 98%   Visual Acuity Right Eye Distance:   Left Eye Distance:   Bilateral Distance:    Right Eye Near:   Left Eye Near:    Bilateral Near:     Physical Exam Constitutional:      Appearance: Normal appearance.  Cardiovascular:     Rate and Rhythm: Normal rate.  Pulmonary:     Effort: Pulmonary effort is normal.  Neurological:     General: No focal deficit present.     Mental Status: He is alert.  Psychiatric:        Mood and Affect: Mood normal.      UC Treatments / Results  Labs (all labs ordered are listed, but only abnormal results are displayed) Labs Reviewed  HEMOGLOBIN A1C    EKG   Radiology No results found.  Procedures Procedures (including critical care time)  Medications Ordered in UC Medications - No data to display  Initial Impression / Assessment and Plan / UC Course  I have reviewed the  triage vital signs and the nursing notes.  Pertinent labs & imaging results that were available during my care of the patient were reviewed by me and considered in my medical decision making (see chart for details).   Final Clinical Impressions(s) / UC Diagnoses   Final diagnoses:  Type 2 diabetes mellitus without complication, without long-term current use of insulin ([place])     Discharge Instructions      ????? A1C ?? ???? ?? ??? ???? ??? ?????? ?? ??? ??? ???? ?????? ??? ?????? ???? ??????? ???? ?? ????? ?? 11/14 ???? ????? ye'irisiwo A1C zar? tesilwali ina bez?hi k'idam?na ihudi yimeretali. yihi k'ut'iri bet'ami kefitenya isikalihone diresi yak'erebutini werek'eti inimolaleni. yihinini k'its'i lemewisedi senyo 11/14 memelesi yichilalu.  Your A1C was drawn today and will be resulted this weekend.  As long as this number is not too high, we will fill out the paperwork you supplied.  You may return on Monday 11/14 to pick up this form.     ED Prescriptions   None    PDMP not reviewed this encounter.   Rondel Oh, MD 12/07/21 1551

## 2021-12-07 NOTE — Discharge Instructions (Addendum)
?????   A1C ?? ???? ?? ??? ???? ??? ?????? ?? ??? ??? ???? ?????? ??? ?????? ???? ??????? ???? ?? ????? ?? 11/14 ???? ????? ye'irisiwo A1C zar? tesilwali ina bez?hi k'idam?na ihudi yimeretali. yihi k'ut'iri bet'ami kefitenya isikalihone diresi yak'erebutini werek'eti inimolaleni. yihinini k'its'i lemewisedi senyo 11/14 memelesi yichilalu.  Your A1C was drawn today and will be resulted this weekend.  As long as this number is not too high, we will fill out the paperwork you supplied.  You may return on Monday 11/14 to pick up this form.

## 2021-12-11 ENCOUNTER — Telehealth (HOSPITAL_COMMUNITY): Payer: Self-pay | Admitting: Internal Medicine

## 2022-01-15 ENCOUNTER — Ambulatory Visit: Payer: Medicaid Other | Admitting: Critical Care Medicine

## 2022-02-14 ENCOUNTER — Ambulatory Visit: Payer: Medicaid Other | Admitting: Physician Assistant

## 2022-04-14 NOTE — Progress Notes (Unsigned)
Established Patient Office Visit  Subjective   Patient ID: Corey Mason, male    DOB: 11-02-1960  Age: 62 y.o. MRN: [id number]  No chief complaint on file.   08/09/21 Corey Mason presents today for follow up on his type 2 diabetes and hypertension. He was last seen in March 2023 with an A1C of 6.1. BP today is 129/74. He does not check his pressures at home but does go to [place] and states they have been within normal limit.  Following our last visit in march he was seen in the ED due to decreased hearing loss on the right. He then followed up with otolaryngology for cerumen removal.  Reports some lightheadedness recently, but denies pain or room spinning.  Also reports a recent visit to the dentist and now has had some residual pain since.   04/16/22 Foot eye flu    Patient Active Problem List   Diagnosis Date Noted   Dental caries 04/11/2021   Type 2 diabetes mellitus without complication, without long-term current use of insulin ([place]) 04/02/2018   Essential hypertension 10/23/2017   Benign non-nodular prostatic hyperplasia with lower urinary tract symptoms 07/05/2014   Past Medical History:  Diagnosis Date   Hypertension    Migraines    Urinary retention    No past surgical history on file. Social History   Tobacco Use   Smoking status: Never   Smokeless tobacco: Never  Substance Use Topics   Alcohol use: Not Currently   Drug use: Never   No family history on file. No Known Allergies    Review of Systems  Constitutional:  Negative for chills, diaphoresis, fever, malaise/fatigue and weight loss.  HENT:  Negative for congestion, hearing loss, nosebleeds, sore throat and tinnitus.        Generalized tooth pain   Eyes:  Negative for blurred vision, photophobia and redness.  Respiratory:  Negative for cough, hemoptysis, sputum production, shortness of breath, wheezing and stridor.   Cardiovascular:  Negative for chest pain, palpitations, orthopnea, claudication,  leg swelling and PND.  Gastrointestinal:  Negative for abdominal pain, blood in stool, constipation, diarrhea, heartburn, nausea and vomiting.  Genitourinary:  Negative for dysuria, flank pain, frequency, hematuria and urgency.  Musculoskeletal:  Negative for back pain, falls, joint pain, myalgias and neck pain.  Skin:  Negative for itching and rash.  Neurological:  Positive for dizziness ("lightheaded" no spinning sensation). Negative for tingling, tremors, sensory change, speech change, focal weakness, seizures, loss of consciousness, weakness and headaches.  Endo/Heme/Allergies:  Negative for environmental allergies and polydipsia. Does not bruise/bleed easily.  Psychiatric/Behavioral:  Negative for depression, memory loss, substance abuse and suicidal ideas. The patient is not nervous/anxious and does not have insomnia.   All other systems reviewed and are negative.     Objective:     There were no vitals taken for this visit.    Physical Exam Vitals reviewed.  Constitutional:      Appearance: Normal appearance. He is well-developed. He is not diaphoretic.  HENT:     Head: Normocephalic and atraumatic.     Right Ear: Tympanic membrane, ear canal and external ear normal.     Left Ear: Tympanic membrane, ear canal and external ear normal.     Nose: Nose normal. No nasal deformity, septal deviation, mucosal edema or rhinorrhea.     Right Sinus: No maxillary sinus tenderness or frontal sinus tenderness.     Left Sinus: No maxillary sinus tenderness or frontal sinus tenderness.  Mouth/Throat:     Mouth: Mucous membranes are moist.     Pharynx: Oropharynx is clear. No oropharyngeal exudate.  Eyes:     General: No scleral icterus.    Conjunctiva/sclera: Conjunctivae normal.     Pupils: Pupils are equal, round, and reactive to light.  Neck:     Thyroid: No thyromegaly.     Vascular: No carotid bruit or JVD.     Trachea: Trachea normal. No tracheal tenderness or tracheal  deviation.  Cardiovascular:     Rate and Rhythm: Normal rate and regular rhythm.     Chest Wall: PMI is not displaced.     Pulses: Normal pulses. No decreased pulses.     Heart sounds: Normal heart sounds, S1 normal and S2 normal. Heart sounds not distant. No murmur heard.    No systolic murmur is present.     No diastolic murmur is present.     No friction rub. No gallop. No S3 or S4 sounds.  Pulmonary:     Effort: Pulmonary effort is normal. No tachypnea, accessory muscle usage or respiratory distress.     Breath sounds: No stridor. No decreased breath sounds, wheezing, rhonchi or rales.  Chest:     Chest wall: No tenderness.  Abdominal:     General: Bowel sounds are normal. There is no distension.     Palpations: Abdomen is soft. Abdomen is not rigid.     Tenderness: There is no abdominal tenderness. There is no guarding or rebound.  Musculoskeletal:        General: Normal range of motion.     Cervical back: Normal range of motion and neck supple. No edema, erythema or rigidity. No muscular tenderness. Normal range of motion.  Lymphadenopathy:     Head:     Right side of head: No submental or submandibular adenopathy.     Left side of head: No submental or submandibular adenopathy.     Cervical: No cervical adenopathy.  Skin:    General: Skin is warm and dry.     Coloration: Skin is not pale.     Findings: No rash.     Nails: There is no clubbing.  Neurological:     General: No focal deficit present.     Mental Status: He is alert and oriented to person, place, and time.     Sensory: No sensory deficit.  Psychiatric:        Mood and Affect: Mood normal.        Speech: Speech normal.        Behavior: Behavior normal.      No results found for any visits on 04/16/22.  Last hemoglobin A1c Lab Results  Component Value Date   HGBA1C 5.5 12/07/2021      The ASCVD Risk score (Arnett DK, et al., 2019) failed to calculate for the following reasons:   The valid total  cholesterol range is 130 to 320 mg/dL    Assessment & Plan:   Problem List Items Addressed This Visit   None  No follow-ups on file.    Asencion Noble, MD

## 2022-04-16 ENCOUNTER — Ambulatory Visit: Payer: Medicaid Other | Attending: Critical Care Medicine | Admitting: Critical Care Medicine

## 2022-04-16 ENCOUNTER — Encounter: Payer: Self-pay | Admitting: Critical Care Medicine

## 2022-04-16 VITALS — BP 110/65 | HR 65 | Ht 67.75 in | Wt 173.6 lb

## 2022-04-16 DIAGNOSIS — I10 Essential (primary) hypertension: Secondary | ICD-10-CM

## 2022-04-16 DIAGNOSIS — E1169 Type 2 diabetes mellitus with other specified complication: Secondary | ICD-10-CM | POA: Diagnosis not present

## 2022-04-16 DIAGNOSIS — E785 Hyperlipidemia, unspecified: Secondary | ICD-10-CM

## 2022-04-16 DIAGNOSIS — K029 Dental caries, unspecified: Secondary | ICD-10-CM

## 2022-04-16 DIAGNOSIS — N401 Enlarged prostate with lower urinary tract symptoms: Secondary | ICD-10-CM

## 2022-04-16 DIAGNOSIS — H6121 Impacted cerumen, right ear: Secondary | ICD-10-CM

## 2022-04-16 DIAGNOSIS — E119 Type 2 diabetes mellitus without complications: Secondary | ICD-10-CM | POA: Diagnosis not present

## 2022-04-16 MED ORDER — TAMSULOSIN HCL 0.4 MG PO CAPS: 0.4000 mg | ORAL_CAPSULE | Freq: Every day | ORAL | 4 refills | Status: DC

## 2022-04-16 MED ORDER — METFORMIN HCL ER 500 MG PO TB24: ORAL_TABLET | ORAL | 2 refills | Status: DC

## 2022-04-16 MED ORDER — ATORVASTATIN CALCIUM 10 MG PO TABS: 10.0000 mg | ORAL_TABLET | Freq: Every day | ORAL | 2 refills | Status: DC

## 2022-04-16 MED ORDER — AMLODIPINE BESYLATE 10 MG PO TABS: 10.0000 mg | ORAL_TABLET | Freq: Every day | ORAL | 2 refills | Status: DC

## 2022-04-16 NOTE — Assessment & Plan Note (Signed)
Hypertension well-controlled continue amlodipine

## 2022-04-16 NOTE — Assessment & Plan Note (Signed)
Continue metformin he is at goal with his A1c

## 2022-04-16 NOTE — Assessment & Plan Note (Addendum)
Continue tamsulosin referral back to urology and obtain PSA

## 2022-04-16 NOTE — Assessment & Plan Note (Signed)
Has dental follow-up

## 2022-04-16 NOTE — Assessment & Plan Note (Signed)
Resolved

## 2022-04-16 NOTE — Patient Instructions (Signed)
Labs today All medication refilled  Ref to urology, eye doctor made  No medication changes  Return 6 months

## 2022-04-17 LAB — COMPREHENSIVE METABOLIC PANEL
ALT: 14 IU/L (ref 0–44)
AST: 12 IU/L (ref 0–40)
Albumin/Globulin Ratio: 1.8 (ref 1.2–2.2)
Albumin: 4.3 g/dL (ref 3.8–4.9)
Alkaline Phosphatase: 78 IU/L (ref 44–121)
BUN/Creatinine Ratio: 11 (ref 10–24)
BUN: 9 mg/dL (ref 8–27)
Bilirubin Total: 0.3 mg/dL (ref 0.0–1.2)
CO2: 24 mmol/L (ref 20–29)
Calcium: 8.9 mg/dL (ref 8.6–10.2)
Chloride: 103 mmol/L (ref 96–106)
Creatinine, Ser: 0.79 mg/dL (ref 0.76–1.27)
Globulin, Total: 2.4 g/dL (ref 1.5–4.5)
Glucose: 115 mg/dL — ABNORMAL HIGH (ref 70–99)
Potassium: 4.7 mmol/L (ref 3.5–5.2)
Sodium: 139 mmol/L (ref 134–144)
Total Protein: 6.7 g/dL (ref 6.0–8.5)
eGFR: 102 mL/min/{1.73_m2} (ref >59)

## 2022-04-17 LAB — CBC WITH DIFFERENTIAL/PLATELET
Basophils Absolute: 0 10*3/uL (ref 0.0–0.2)
Basos: 0 %
EOS (ABSOLUTE): 0.1 10*3/uL (ref 0.0–0.4)
Eos: 2 %
Hematocrit: 42.8 % (ref 37.5–51.0)
Hemoglobin: 14.7 g/dL (ref 13.0–17.7)
Immature Grans (Abs): 0 10*3/uL (ref 0.0–0.1)
Immature Granulocytes: 0 %
Lymphocytes Absolute: 1.4 10*3/uL (ref 0.7–3.1)
Lymphs: 21 %
MCH: 30.1 pg (ref 26.6–33.0)
MCHC: 34.3 g/dL (ref 31.5–35.7)
MCV: 88 fL (ref 79–97)
Monocytes Absolute: 0.5 10*3/uL (ref 0.1–0.9)
Monocytes: 8 %
Neutrophils Absolute: 4.6 10*3/uL (ref 1.4–7.0)
Neutrophils: 69 %
Platelets: 266 10*3/uL (ref 150–450)
RBC: 4.89 x10E6/uL (ref 4.14–5.80)
RDW: 12.2 % (ref 11.6–15.4)
WBC: 6.6 10*3/uL (ref 3.4–10.8)

## 2022-04-17 LAB — LIPID PANEL
Chol/HDL Ratio: 2.4 ratio (ref 0.0–5.0)
Cholesterol, Total: 113 mg/dL (ref 100–199)
HDL: 48 mg/dL (ref >39)
LDL Chol Calc (NIH): 54 mg/dL (ref 0–99)
Triglycerides: 45 mg/dL (ref 0–149)
VLDL Cholesterol Cal: 11 mg/dL (ref 5–40)

## 2022-04-17 LAB — HEMOGLOBIN A1C
Est. average glucose Bld gHb Est-mCnc: 123 mg/dL
Hgb A1c MFr Bld: 5.9 % — ABNORMAL HIGH (ref 4.8–5.6)

## 2022-04-17 LAB — PSA: Prostate Specific Ag, Serum: 0.2 ng/mL (ref 0.0–4.0)

## 2022-04-17 NOTE — Progress Notes (Signed)
Let pt know all labs normal  Diabetes at goal cholesterol good, no prostate cancer,  no medication changes

## 2022-04-22 ENCOUNTER — Telehealth: Payer: Self-pay

## 2022-04-22 NOTE — Telephone Encounter (Signed)
Pt was called and is aware of results, DOB was confirmed.  Interpreter id [id number]

## 2022-04-22 NOTE — Telephone Encounter (Signed)
-----   Message from Elsie Stain, MD sent at 04/17/2022 12:25 PM EDT ----- Let pt know all labs normal  Diabetes at goal cholesterol good, no prostate cancer,  no medication changes

## 2022-06-05 ENCOUNTER — Encounter: Payer: Self-pay | Admitting: Urology

## 2022-06-05 ENCOUNTER — Ambulatory Visit (INDEPENDENT_AMBULATORY_CARE_PROVIDER_SITE_OTHER): Payer: Medicaid Other | Admitting: Urology

## 2022-06-05 VITALS — BP 135/78 | HR 67 | Ht 68.0 in | Wt 166.6 lb

## 2022-06-05 DIAGNOSIS — N138 Other obstructive and reflux uropathy: Secondary | ICD-10-CM

## 2022-06-05 DIAGNOSIS — N401 Enlarged prostate with lower urinary tract symptoms: Secondary | ICD-10-CM

## 2022-06-05 LAB — URINALYSIS, ROUTINE W REFLEX MICROSCOPIC
Bilirubin, UA: NEGATIVE
Glucose, UA: NEGATIVE
Leukocytes,UA: NEGATIVE
Nitrite, UA: NEGATIVE
Protein,UA: NEGATIVE
RBC, UA: NEGATIVE
Specific Gravity, UA: 1.025 (ref 1.005–1.030)
Urobilinogen, Ur: 0.2 mg/dL (ref 0.2–1.0)
pH, UA: 5.5 (ref 5.0–7.5)

## 2022-06-05 NOTE — Progress Notes (Signed)
   Assessment: 1. BPH with obstruction/lower urinary tract symptoms     Plan: I personally reviewed the patient's chart including provider notes, and lab results. Continue tamsulosin Recommend annual prostate cancer screening with DRE and PSA Return to office prn  Chief Complaint:  Chief Complaint  Patient presents with   Benign Prostatic Hypertrophy    History of Present Illness:  Corey Mason is a 62 y.o. [place] male who is seen in consultation from Storm Frisk, MD for evaluation of BPH with lower urinary tract symptoms.  He is currently on tamsulosin.  He is not having any significant lower urinary tract symptoms at the present time.  He reports nocturia x 2.  No dysuria or gross hematuria.  No history of UTIs or prostatitis.  No history of prior urologic surgery. IPSS = 4 today  PSA 3/24: 0.2  History was obtained through an interpreter.  Past Medical History:  Past Medical History:  Diagnosis Date   Hearing loss of right ear due to cerumen impaction 06/23/2019   Hypertension    Migraines    Urinary retention     Past Surgical History:  No past surgical history on file.  Allergies:  No Known Allergies  Family History:  No family history on file.  Social History:  Social History   Tobacco Use   Smoking status: Never   Smokeless tobacco: Never  Substance Use Topics   Alcohol use: Not Currently   Drug use: Never    Review of symptoms:  Constitutional:  Negative for unexplained weight loss, night sweats, fever, chills ENT:  Negative for nose bleeds, sinus pain, painful swallowing CV:  Negative for chest pain, shortness of breath, exercise intolerance, palpitations, loss of consciousness Resp:  Negative for cough, wheezing, shortness of breath GI:  Negative for nausea, vomiting, diarrhea, bloody stools GU:  Positives noted in HPI; otherwise negative for gross hematuria, dysuria, urinary incontinence Neuro:  Negative for seizures, poor balance,  limb weakness, slurred speech Psych:  Negative for lack of energy, depression, anxiety Endocrine:  Negative for polydipsia, polyuria, symptoms of hypoglycemia (dizziness, hunger, sweating) Hematologic:  Negative for anemia, purpura, petechia, prolonged or excessive bleeding, use of anticoagulants  Allergic:  Negative for difficulty breathing or choking as a result of exposure to anything; no shellfish allergy; no allergic response (rash/itch) to materials, foods  Physical exam: BP 135/78   Pulse 67   Ht 5\' 8"  (1.727 m)   Wt 166 lb 9.6 oz (75.6 kg)   BMI 25.33 kg/m  GENERAL APPEARANCE:  Well appearing, well developed, well nourished, NAD HEENT: Atraumatic, Normocephalic, oropharynx clear. NECK: Supple without lymphadenopathy or thyromegaly. LUNGS: Clear to auscultation bilaterally. HEART: Regular Rate and Rhythm without murmurs, gallops, or rubs. ABDOMEN: Soft, non-tender, No Masses. EXTREMITIES: Moves all extremities well.  Without clubbing, cyanosis, or edema. NEUROLOGIC:  Alert and oriented x 3, normal gait, CN II-XII grossly intact.  MENTAL STATUS:  Appropriate. BACK:  Non-tender to palpation.  No CVAT SKIN:  Warm, dry and intact.   GU: Penis:  circumcised Meatus: Normal Scrotum: normal, no masses Testis: normal without masses bilateral Epididymis: normal Prostate: 40 g, NT, no nodules Rectum: Normal tone,  no masses or tenderness   Results: U/A:  negative

## 2022-10-17 ENCOUNTER — Ambulatory Visit: Payer: Medicaid Other | Attending: Critical Care Medicine | Admitting: Critical Care Medicine

## 2022-10-17 ENCOUNTER — Encounter: Payer: Self-pay | Admitting: Critical Care Medicine

## 2022-10-17 VITALS — BP 120/72 | HR 76 | Wt 178.2 lb

## 2022-10-17 DIAGNOSIS — E1169 Type 2 diabetes mellitus with other specified complication: Secondary | ICD-10-CM | POA: Diagnosis not present

## 2022-10-17 DIAGNOSIS — I10 Essential (primary) hypertension: Secondary | ICD-10-CM | POA: Diagnosis not present

## 2022-10-17 DIAGNOSIS — E119 Type 2 diabetes mellitus without complications: Secondary | ICD-10-CM

## 2022-10-17 DIAGNOSIS — N138 Other obstructive and reflux uropathy: Secondary | ICD-10-CM

## 2022-10-17 DIAGNOSIS — N401 Enlarged prostate with lower urinary tract symptoms: Secondary | ICD-10-CM | POA: Diagnosis not present

## 2022-10-17 DIAGNOSIS — Z7984 Long term (current) use of oral hypoglycemic drugs: Secondary | ICD-10-CM

## 2022-10-17 DIAGNOSIS — E785 Hyperlipidemia, unspecified: Secondary | ICD-10-CM

## 2022-10-17 LAB — POCT GLYCOSYLATED HEMOGLOBIN (HGB A1C): HbA1c, POC (controlled diabetic range): 6.1 % (ref 0.0–7.0)

## 2022-10-17 LAB — GLUCOSE, POCT (MANUAL RESULT ENTRY): POC Glucose: 205 mg/dl — AB (ref 70–99)

## 2022-10-17 MED ORDER — ACCU-CHEK GUIDE W/DEVICE KIT: PACK | 0 refills | Status: AC

## 2022-10-17 MED ORDER — ACCU-CHEK GUIDE VI STRP: ORAL_STRIP | 12 refills | Status: DC

## 2022-10-17 MED ORDER — METFORMIN HCL ER 500 MG PO TB24: ORAL_TABLET | ORAL | 2 refills | Status: DC

## 2022-10-17 MED ORDER — ATORVASTATIN CALCIUM 10 MG PO TABS: 10.0000 mg | ORAL_TABLET | Freq: Every day | ORAL | 2 refills | Status: DC

## 2022-10-17 MED ORDER — AMLODIPINE BESYLATE 10 MG PO TABS: 10.0000 mg | ORAL_TABLET | Freq: Every day | ORAL | 2 refills | Status: DC

## 2022-10-17 MED ORDER — ACCU-CHEK SOFTCLIX LANCETS MISC: 12 refills | Status: DC

## 2022-10-17 MED ORDER — TAMSULOSIN HCL 0.4 MG PO CAPS: 0.4000 mg | ORAL_CAPSULE | Freq: Every day | ORAL | 2 refills | Status: DC

## 2022-10-17 NOTE — Progress Notes (Signed)
Bs 205  A1c 6.1

## 2022-10-17 NOTE — Assessment & Plan Note (Addendum)
Type 2 diabetes currently controlled on metformin alone instructed patient to use his blood glucose meter at least daily and a new prescription for the meter and supplies were sent to pharmacy  Referral to ophthalmology made

## 2022-10-17 NOTE — Assessment & Plan Note (Signed)
Assess lipids and for now stay on 10 mg atorvastatin

## 2022-10-17 NOTE — Progress Notes (Addendum)
Established Patient Office Visit  Subjective   Patient ID: Corey Mason, male    DOB: 23-Mar-1960  Age: 62 y.o. MRN: [id number]   Chief Complaint  Patient presents with   Medical Management of Chronic Issues    08/09/21 Mr Stoudt presents today for follow up on his type 2 diabetes and hypertension. He was last seen in March 2023 with an A1C of 6.1. BP today is 129/74. He does not check his pressures at home but does go to [place] and states they have been within normal limit.  Following our last visit in march he was seen in the ED due to decreased hearing loss on the right. He then followed up with otolaryngology for cerumen removal.  Reports some lightheadedness recently, but denies pain or room spinning.  Also reports a recent visit to the dentist and now has had some residual pain since.   04/16/22 Patient seen in return follow-up.  He needs an eye exam for his diabetes.  Needs follow-up A1c.  He has been compliant with his medication which includes metformin daily.  He is taking amlodipine 10 mg daily for blood pressure blood pressure is good today 110/65.  Patient needs a foot exam and patient needs an ophthalmology exam referral will be made.  Patient would like to follow back up with urology.  He states tamsulosin does help.  He has BPH  10/17/22 The patient seen in return follow-up Arabic interpreter assisted [place] on #[id number] The patient on arrival has a blood pressure of 120/72.  His blood sugar is 205 at this visit.  His A1c is 6.5.  Patient maintains metformin alone.  He does not routinely check his blood sugar.  Blood pressure is controlled with the amlodipine 10 mg daily.  He also maintains tamsulosin for BPH.  Maintains cholesterol therapy.  There are no other questions or concerns.  He needs an eye exam he does not want to go back to [place] he did not like the care.  He does need a urine sample for protein.      Patient Active Problem List   Diagnosis Date Noted    Hyperlipidemia due to type 2 diabetes mellitus (HCC) 10/17/2022   Dental caries 04/11/2021   Type 2 diabetes mellitus without complication, without long-term current use of insulin (HCC) 04/02/2018   Essential hypertension 10/23/2017   BPH with obstruction/lower urinary tract symptoms 07/05/2014   Past Medical History:  Diagnosis Date   Hearing loss of right ear due to cerumen impaction 06/23/2019   Hypertension    Migraines    Urinary retention    History reviewed. No pertinent surgical history. Social History   Tobacco Use   Smoking status: Never   Smokeless tobacco: Never  Substance Use Topics   Alcohol use: Not Currently   Drug use: Never   History reviewed. No pertinent family history. No Known Allergies    Review of Systems  Constitutional:  Negative for chills, diaphoresis, fever, malaise/fatigue and weight loss.  HENT:  Negative for congestion, hearing loss, nosebleeds, sore throat and tinnitus.        Generalized tooth pain   Eyes:  Negative for blurred vision, photophobia and redness.  Respiratory:  Negative for cough, hemoptysis, sputum production, shortness of breath, wheezing and stridor.   Cardiovascular:  Negative for chest pain, palpitations, orthopnea, claudication, leg swelling and PND.  Gastrointestinal:  Negative for abdominal pain, blood in stool, constipation, diarrhea, heartburn, nausea and vomiting.  Genitourinary:  Negative for dysuria, flank  pain, frequency, hematuria and urgency.  Musculoskeletal:  Negative for back pain, falls, joint pain, myalgias and neck pain.  Skin:  Negative for itching and rash.  Neurological:  Negative for dizziness ("lightheaded" no spinning sensation), tingling, tremors, sensory change, speech change, focal weakness, seizures, loss of consciousness, weakness and headaches.  Endo/Heme/Allergies:  Negative for environmental allergies and polydipsia. Does not bruise/bleed easily.  Psychiatric/Behavioral:  Negative for  depression, memory loss, substance abuse and suicidal ideas. The patient is not nervous/anxious and does not have insomnia.   All other systems reviewed and are negative.     Objective:     BP 120/72   Pulse 76   Wt 178 lb 3.2 oz (80.8 kg)   SpO2 99%   BMI 27.10 kg/m     Physical Exam Vitals reviewed.  Constitutional:      Appearance: Normal appearance. He is well-developed. He is not diaphoretic.  HENT:     Head: Normocephalic and atraumatic.     Right Ear: Tympanic membrane, ear canal and external ear normal.     Left Ear: Tympanic membrane, ear canal and external ear normal.     Nose: Nose normal. No nasal deformity, septal deviation, mucosal edema or rhinorrhea.     Right Sinus: No maxillary sinus tenderness or frontal sinus tenderness.     Left Sinus: No maxillary sinus tenderness or frontal sinus tenderness.     Mouth/Throat:     Mouth: Mucous membranes are moist.     Pharynx: Oropharynx is clear. No oropharyngeal exudate.  Eyes:     General: No scleral icterus.    Conjunctiva/sclera: Conjunctivae normal.     Pupils: Pupils are equal, round, and reactive to light.  Neck:     Thyroid: No thyromegaly.     Vascular: No carotid bruit or JVD.     Trachea: Trachea normal. No tracheal tenderness or tracheal deviation.  Cardiovascular:     Rate and Rhythm: Normal rate and regular rhythm.     Chest Wall: PMI is not displaced.     Pulses: Normal pulses. No decreased pulses.     Heart sounds: Normal heart sounds, S1 normal and S2 normal. Heart sounds not distant. No murmur heard.    No systolic murmur is present.     No diastolic murmur is present.     No friction rub. No gallop. No S3 or S4 sounds.  Pulmonary:     Effort: Pulmonary effort is normal. No tachypnea, accessory muscle usage or respiratory distress.     Breath sounds: No stridor. No decreased breath sounds, wheezing, rhonchi or rales.  Chest:     Chest wall: No tenderness.  Abdominal:     General: Bowel  sounds are normal. There is no distension.     Palpations: Abdomen is soft. Abdomen is not rigid.     Tenderness: There is no abdominal tenderness. There is no guarding or rebound.  Musculoskeletal:        General: Normal range of motion.     Cervical back: Normal range of motion and neck supple. No edema, erythema or rigidity. No muscular tenderness. Normal range of motion.     Comments: Foot exam normal  Lymphadenopathy:     Head:     Right side of head: No submental or submandibular adenopathy.     Left side of head: No submental or submandibular adenopathy.     Cervical: No cervical adenopathy.  Skin:    General: Skin is warm and dry.  Coloration: Skin is not pale.     Findings: No rash.     Nails: There is no clubbing.  Neurological:     General: No focal deficit present.     Mental Status: He is alert and oriented to person, place, and time.     Sensory: No sensory deficit.  Psychiatric:        Mood and Affect: Mood normal.        Speech: Speech normal.        Behavior: Behavior normal.      Results for orders placed or performed in visit on 10/17/22  HgB A1c  Result Value Ref Range   Hemoglobin A1C     HbA1c POC (<> result, manual entry)     HbA1c, POC (prediabetic range)     HbA1c, POC (controlled diabetic range) 6.1 0.0 - 7.0 %  POCT glucose (manual entry)  Result Value Ref Range   POC Glucose 205 (A) 70 - 99 mg/dl    Last hemoglobin Z5G Lab Results  Component Value Date   HGBA1C 6.1 10/17/2022      The ASCVD Risk score (Arnett DK, et al., 2019) failed to calculate for the following reasons:   The valid total cholesterol range is 130 to 320 mg/dL    Assessment & Plan:   Problem List Items Addressed This Visit       Cardiovascular and Mediastinum   Essential hypertension    Hypertension well-controlled at this time continue with the amlodipine check labs      Relevant Medications   amLODipine (NORVASC) 10 MG tablet   atorvastatin (LIPITOR) 10  MG tablet     Endocrine   Type 2 diabetes mellitus without complication, without long-term current use of insulin (HCC) - Primary    Type 2 diabetes currently controlled on metformin alone instructed patient to use his blood glucose meter at least daily and a new prescription for the meter and supplies were sent to pharmacy  Referral to ophthalmology made      Relevant Medications   atorvastatin (LIPITOR) 10 MG tablet   metFORMIN (GLUCOPHAGE-XR) 500 MG 24 hr tablet   Other Relevant Orders   HgB A1c (Completed)   Urine microalbumin-creatinine with uACR   Comprehensive metabolic panel with eGFR (no eGFR if sent to [facility])   Ambulatory referral to Ophthalmology   POCT glucose (manual entry) (Completed)   Hyperlipidemia due to type 2 diabetes mellitus (HCC)    Assess lipids and for now stay on 10 mg atorvastatin      Relevant Medications   amLODipine (NORVASC) 10 MG tablet   atorvastatin (LIPITOR) 10 MG tablet   metFORMIN (GLUCOPHAGE-XR) 500 MG 24 hr tablet     Genitourinary   BPH with obstruction/lower urinary tract symptoms    Symptoms resolved on tamsulosin PSA was normal previously      Relevant Medications   tamsulosin (FLOMAX) 0.4 MG CAPS capsule   Other Visit Diagnoses     Diabetes mellitus treated with oral medication (HCC)       Relevant Medications   atorvastatin (LIPITOR) 10 MG tablet   metFORMIN (GLUCOPHAGE-XR) 500 MG 24 hr tablet     30 minutes spent language barrier prolonged visit  Return in about 6 months (around 04/16/2023) for htn, diabetes.    Shan Levans, MD

## 2022-10-17 NOTE — Assessment & Plan Note (Signed)
Hypertension well-controlled at this time continue with the amlodipine check labs

## 2022-10-17 NOTE — Assessment & Plan Note (Signed)
Symptoms resolved on tamsulosin PSA was normal previously

## 2022-10-17 NOTE — Patient Instructions (Signed)
All medications sent to your pharmacy Get blood sugar meter and test blood sugar 1-3 times a week in the morning fasting  Labs today Eye referral will be made  Return 6 months  Diabetes and blood pressure well controlled

## 2022-10-18 LAB — MICROALBUMIN / CREATININE URINE RATIO
Creatinine, Urine: 67.7 mg/dL
Microalb/Creat Ratio: <4 mg/g creat (ref 0–29)
Microalbumin, Urine: <3 ug/mL

## 2022-10-18 LAB — COMPREHENSIVE METABOLIC PANEL
ALT: 19 IU/L (ref 0–44)
AST: 13 IU/L (ref 0–40)
Albumin: 4.5 g/dL (ref 3.8–4.9)
Alkaline Phosphatase: 68 IU/L (ref 44–121)
BUN/Creatinine Ratio: 13 (ref 10–24)
BUN: 12 mg/dL (ref 8–27)
Bilirubin Total: 0.4 mg/dL (ref 0.0–1.2)
CO2: 24 mmol/L (ref 20–29)
Calcium: 9 mg/dL (ref 8.6–10.2)
Chloride: 103 mmol/L (ref 96–106)
Creatinine, Ser: 0.89 mg/dL (ref 0.76–1.27)
Globulin, Total: 2.6 g/dL (ref 1.5–4.5)
Glucose: 71 mg/dL (ref 70–99)
Potassium: 4.3 mmol/L (ref 3.5–5.2)
Sodium: 143 mmol/L (ref 134–144)
Total Protein: 7.1 g/dL (ref 6.0–8.5)
eGFR: 98 mL/min/{1.73_m2} (ref >59)

## 2022-10-21 NOTE — Progress Notes (Signed)
Let pt know no protein in urine , liver kidney normal,  diabetes under good control

## 2022-10-24 ENCOUNTER — Telehealth: Payer: Self-pay

## 2022-10-24 NOTE — Telephone Encounter (Signed)
Pt was called and is aware of results, DOB was confirmed.  Interpreter id # [phone]

## 2022-10-24 NOTE — Telephone Encounter (Signed)
-----   Message from Corey Mason sent at 10/21/2022  4:38 AM EDT ----- Let pt know no protein in urine , liver kidney normal,  diabetes under good control

## 2023-02-07 NOTE — Telephone Encounter (Signed)
 No documetnation

## 2023-04-16 ENCOUNTER — Ambulatory Visit: Payer: Medicaid Other | Admitting: Family Medicine

## 2023-06-09 ENCOUNTER — Encounter: Payer: Self-pay | Admitting: Family Medicine

## 2023-06-09 ENCOUNTER — Ambulatory Visit: Attending: Family Medicine | Admitting: Family Medicine

## 2023-06-09 VITALS — BP 137/76 | HR 80 | Ht 68.0 in | Wt 174.0 lb

## 2023-06-09 DIAGNOSIS — Z125 Encounter for screening for malignant neoplasm of prostate: Secondary | ICD-10-CM | POA: Diagnosis not present

## 2023-06-09 DIAGNOSIS — E1169 Type 2 diabetes mellitus with other specified complication: Secondary | ICD-10-CM

## 2023-06-09 DIAGNOSIS — Z7984 Long term (current) use of oral hypoglycemic drugs: Secondary | ICD-10-CM

## 2023-06-09 DIAGNOSIS — E119 Type 2 diabetes mellitus without complications: Secondary | ICD-10-CM | POA: Diagnosis not present

## 2023-06-09 DIAGNOSIS — E1159 Type 2 diabetes mellitus with other circulatory complications: Secondary | ICD-10-CM

## 2023-06-09 DIAGNOSIS — I152 Hypertension secondary to endocrine disorders: Secondary | ICD-10-CM

## 2023-06-09 DIAGNOSIS — E785 Hyperlipidemia, unspecified: Secondary | ICD-10-CM

## 2023-06-09 LAB — POCT GLYCOSYLATED HEMOGLOBIN (HGB A1C): HbA1c, POC (controlled diabetic range): 5.9 % (ref 0.0–7.0)

## 2023-06-09 NOTE — Progress Notes (Signed)
 Subjective:  Patient ID: Corey Mason, male    DOB: 1961/01/07  Age: 63 y.o. MRN: [id number]  CC: Medical Management of Chronic Issues (No concerns )     Discussed the use of AI scribe software for clinical note transcription with the patient, who gave verbal consent to proceed.  History of Present Illness Corey Mason is a 63 year old male with a history of hypertension, type 2 diabetes mellitus, BPH who presents for a routine checkup.  Previous patient of Dr. Brent Cambric.  His diabetes is well controlled with an A1c of 6.1 as of September 2024 and is 5.9 today. He experiences no neuropathy symptoms. He had an eye exam four months ago and now uses eyeglasses.  Endorses adherence with his statin and antihypertensive. He takes Flomax  for BPH without related symptoms. He does not engage in regular exercise but his job involves some physical activity, and he occasionally walks.    Past Medical History:  Diagnosis Date   Hearing loss of right ear due to cerumen impaction 06/23/2019   Hypertension    Migraines    Urinary retention     No past surgical history on file.  No family history on file.  Social History   Socioeconomic History   Marital status: Married    Spouse name: Not on file   Number of children: Not on file   Years of education: Not on file   Highest education level: Not on file  Occupational History   Not on file  Tobacco Use   Smoking status: Never   Smokeless tobacco: Never  Substance and Sexual Activity   Alcohol use: Not Currently   Drug use: Never   Sexual activity: Not Currently  Other Topics Concern   Not on file  Social History Narrative   ** Merged History Encounter **       Social Drivers of Health   Financial Resource Strain: Not on file  Food Insecurity: Not on file  Transportation Needs: Not on file  Physical Activity: Not on file  Stress: Not on file  Social Connections: Not on file    No Known Allergies  Outpatient  Medications Prior to Visit  Medication Sig Dispense Refill   Accu-Chek Softclix Lancets lancets Use as instructed 100 each 12   atorvastatin  (LIPITOR) 10 MG tablet Take 1 tablet (10 mg total) by mouth once daily. 90 tablet 2   Blood Glucose Monitoring Suppl (ACCU-CHEK GUIDE) w/Device KIT Use to check blood sugar 1 kit 0   glucose blood (ACCU-CHEK GUIDE) test strip Use as instructed 100 each 12   metFORMIN  (GLUCOPHAGE -XR) 500 MG 24 hr tablet TAKE 1 TABLET(500 MG) BY MOUTH DAILY WITH BREAKFAST 90 tablet 2   tamsulosin  (FLOMAX ) 0.4 MG CAPS capsule Take 1 capsule (0.4 mg total) by mouth daily. 90 capsule 2   amLODipine  (NORVASC ) 10 MG tablet Take 1 tablet (10 mg total) by mouth daily. 90 tablet 2   No facility-administered medications prior to visit.     ROS Review of Systems  Constitutional:  Negative for activity change and appetite change.  HENT:  Negative for sinus pressure and sore throat.   Respiratory:  Negative for chest tightness, shortness of breath and wheezing.   Cardiovascular:  Negative for chest pain and palpitations.  Gastrointestinal:  Negative for abdominal distention, abdominal pain and constipation.  Genitourinary: Negative.   Musculoskeletal: Negative.   Psychiatric/Behavioral:  Negative for behavioral problems and dysphoric mood.     Objective:  BP 137/76  Pulse 80   Ht 5\' 8"  (1.727 m)   Wt 174 lb (78.9 kg)   SpO2 99%   BMI 26.46 kg/m      06/09/2023    2:04 PM 10/17/2022   11:37 AM 10/17/2022   10:33 AM  BP/Weight  Systolic BP 137 120 142  Diastolic BP 76 72 70  Wt. (Lbs) 174  178.2  BMI 26.46 kg/m2  27.1 kg/m2      Physical Exam Constitutional:      Appearance: He is well-developed.  Cardiovascular:     Rate and Rhythm: Normal rate.     Heart sounds: Normal heart sounds. No murmur heard. Pulmonary:     Effort: Pulmonary effort is normal.     Breath sounds: Normal breath sounds. No wheezing or rales.  Chest:     Chest wall: No tenderness.   Abdominal:     General: Bowel sounds are normal. There is no distension.     Palpations: Abdomen is soft. There is no mass.     Tenderness: There is no abdominal tenderness.  Musculoskeletal:        General: Normal range of motion.     Right lower leg: No edema.     Left lower leg: No edema.  Neurological:     Mental Status: He is alert and oriented to person, place, and time.  Psychiatric:        Mood and Affect: Mood normal.        Latest Ref Rng & Units 10/17/2022   11:38 AM 04/16/2022   11:07 AM 08/13/2021    2:46 PM  CMP  Glucose 70 - 99 mg/dL 71  528  413   BUN 8 - 27 mg/dL 12  9  16    Creatinine 0.76 - 1.27 mg/dL 2.44  0.10  2.72   Sodium 134 - 144 mmol/L 143  139  141   Potassium 3.5 - 5.2 mmol/L 4.3  4.7  4.3   Chloride 96 - 106 mmol/L 103  103  103   CO2 20 - 29 mmol/L 24  24  25    Calcium  8.6 - 10.2 mg/dL 9.0  8.9  9.4   Total Protein 6.0 - 8.5 g/dL 7.1  6.7  7.3   Total Bilirubin 0.0 - 1.2 mg/dL 0.4  0.3  0.5   Alkaline Phos 44 - 121 IU/L 68  78  63   AST 0 - 40 IU/L 13  12  16    ALT 0 - 44 IU/L 19  14  15      Lipid Panel     Component Value Date/Time   CHOL 113 04/16/2022 1107   TRIG 45 04/16/2022 1107   HDL 48 04/16/2022 1107   CHOLHDL 2.4 04/16/2022 1107   LDLCALC 54 04/16/2022 1107    CBC    Component Value Date/Time   WBC 6.6 04/16/2022 1107   WBC 10.3 11/30/2019 1214   RBC 4.89 04/16/2022 1107   RBC 5.37 11/30/2019 1214   HGB 14.7 04/16/2022 1107   HCT 42.8 04/16/2022 1107   PLT 266 04/16/2022 1107   MCV 88 04/16/2022 1107   MCH 30.1 04/16/2022 1107   MCH 29.4 11/30/2019 1214   MCHC 34.3 04/16/2022 1107   MCHC 33.4 11/30/2019 1214   RDW 12.2 04/16/2022 1107   LYMPHSABS 1.4 04/16/2022 1107   MONOABS 0.6 11/30/2019 1214   EOSABS 0.1 04/16/2022 1107   BASOSABS 0.0 04/16/2022 1107    Lab Results  Component Value Date  HGBA1C 5.9 06/09/2023       Assessment & Plan Type 2 diabetes mellitus with other specific  complications Diabetes well-controlled with A1c of 5.9. No neuropathy symptoms. Recent eye exam resulted in eyeglasses prescription. - Continue current diabetes medication -metformin  - Counseled on Diabetic diet, my plate method, 960 minutes of moderate intensity exercise/week Blood sugar logs with fasting goals of 80-120 mg/dl, random of less than 454 and in the event of sugars less than 60 mg/dl or greater than 098 mg/dl encouraged to notify the clinic. Advised on the need for annual eye exams, annual foot exams, Pneumonia vaccine.   Hypertension - Controlled - Continue amlodipine  -Counseled on blood pressure goal of less than 130/80, low-sodium, DASH diet, medication compliance, 150 minutes of moderate intensity exercise per week. Discussed medication compliance, adverse effects.   Hyperlipidemia - Controlled - Will check lipid panel - Continue atorvastatin  - Low-cholesterol diet  BPH - Asymptomatic - Continue Flomax  - Will send of PSA   Wellness Visit Routine visit with no specific concerns. Excellent blood pressure, normal heart and lung sounds, good circulation and sensation in feet. Walking as exercise. - Would love to administer PCV 20 but we are out of this in the clinic he will receive this at his next visit. - Schedule fasting blood test for June 9th. - Schedule follow-up appointment in six months.       No orders of the defined types were placed in this encounter.   Follow-up: Return in about 6 months (around 12/10/2023) for Chronic medical conditions.       Joaquin Mulberry, MD, FAAFP. [facility] [place], [place] [phone]   06/09/2023, 6:19 PM

## 2023-06-09 NOTE — Patient Instructions (Addendum)
 VISIT SUMMARY:  You had a routine checkup today. Your diabetes is well controlled with an A1c of 5.9, and you have no symptoms of neuropathy. You had an eye exam four months ago and now use eyeglasses. Your blood pressure is excellent, and your heart and lung sounds are normal. You have good circulation and sensation in your feet. You received a pneumonia vaccine today.  YOUR PLAN:  -TYPE 2 DIABETES MELLITUS WITHOUT COMPLICATIONS: Type 2 diabetes is a condition where your body does not use insulin properly, leading to high blood sugar levels. Your diabetes is well controlled with an A1c of 5.9, and you have no symptoms of neuropathy. Continue your current diabetes medications. We will check your A1c result before you leave today and call you with the results of your blood test after one month.  -WELLNESS VISIT: This was a routine visit with no specific concerns. Your blood pressure is excellent, your heart and lung sounds are normal, and you have good circulation and sensation in your feet. Please schedule a fasting blood test for June 9th and a follow-up appointment in six months.  INSTRUCTIONS:  Please schedule a fasting blood test for June 9th and a follow-up appointment in six months. We will call you with the results of your blood test after one month.

## 2023-07-06 ENCOUNTER — Other Ambulatory Visit: Payer: Self-pay | Admitting: Critical Care Medicine

## 2023-07-07 ENCOUNTER — Other Ambulatory Visit

## 2023-07-24 ENCOUNTER — Other Ambulatory Visit

## 2023-07-28 ENCOUNTER — Ambulatory Visit: Attending: Critical Care Medicine

## 2023-07-28 ENCOUNTER — Other Ambulatory Visit

## 2023-07-28 DIAGNOSIS — E119 Type 2 diabetes mellitus without complications: Secondary | ICD-10-CM

## 2023-07-28 DIAGNOSIS — Z125 Encounter for screening for malignant neoplasm of prostate: Secondary | ICD-10-CM

## 2023-07-28 DIAGNOSIS — E1169 Type 2 diabetes mellitus with other specified complication: Secondary | ICD-10-CM

## 2023-07-29 ENCOUNTER — Ambulatory Visit: Payer: Self-pay | Admitting: Family Medicine

## 2023-07-30 LAB — MICROALBUMIN / CREATININE URINE RATIO
Creatinine, Urine: 152.8 mg/dL
Microalb/Creat Ratio: 3 mg/g{creat} (ref 0–29)
Microalbumin, Urine: 4.3 ug/mL

## 2023-07-30 LAB — CMP14+EGFR
ALT: 15 IU/L (ref 0–44)
AST: 15 IU/L (ref 0–40)
Albumin: 4.7 g/dL (ref 3.9–4.9)
Alkaline Phosphatase: 69 IU/L (ref 44–121)
BUN/Creatinine Ratio: 19 (ref 10–24)
BUN: 17 mg/dL (ref 8–27)
Bilirubin Total: 0.4 mg/dL (ref 0.0–1.2)
CO2: 21 mmol/L (ref 20–29)
Calcium: 9.4 mg/dL (ref 8.6–10.2)
Chloride: 101 mmol/L (ref 96–106)
Creatinine, Ser: 0.89 mg/dL (ref 0.76–1.27)
Globulin, Total: 2.6 g/dL (ref 1.5–4.5)
Glucose: 109 mg/dL — ABNORMAL HIGH (ref 70–99)
Potassium: 4.4 mmol/L (ref 3.5–5.2)
Sodium: 140 mmol/L (ref 134–144)
Total Protein: 7.3 g/dL (ref 6.0–8.5)
eGFR: 97 mL/min/{1.73_m2} (ref >59)

## 2023-07-30 LAB — LP+NON-HDL CHOLESTEROL
Cholesterol, Total: 124 mg/dL (ref 100–199)
HDL: 47 mg/dL (ref >39)
LDL Chol Calc (NIH): 68 mg/dL (ref 0–99)
Total Non-HDL-Chol (LDL+VLDL): 77 mg/dL (ref 0–129)
Triglycerides: 35 mg/dL (ref 0–149)
VLDL Cholesterol Cal: 9 mg/dL (ref 5–40)

## 2023-07-30 LAB — PSA, TOTAL AND FREE
PSA, Free Pct: 40 %
PSA, Free: 0.08 ng/mL
Prostate Specific Ag, Serum: 0.2 ng/mL (ref 0.0–4.0)

## 2023-08-12 ENCOUNTER — Other Ambulatory Visit: Payer: Self-pay | Admitting: Critical Care Medicine

## 2023-10-06 ENCOUNTER — Other Ambulatory Visit: Payer: Self-pay | Admitting: Critical Care Medicine

## 2023-10-07 NOTE — Telephone Encounter (Signed)
 Requested medications are due for refill today.  yes  Requested medications are on the active medications list.  yes  Last refill. 10/17/2022 #90 2 rf  Future visit scheduled.   yes  Notes to clinic.  Labs are expired.    Requested Prescriptions  Pending Prescriptions Disp Refills   metFORMIN  (GLUCOPHAGE -XR) 500 MG 24 hr tablet [Pharmacy Med Name: METFORMIN  ER 500MG  24HR TABS] 90 tablet 2    Sig: TAKE 1 TABLET(500 MG) BY MOUTH DAILY WITH BREAKFAST     Endocrinology:  Diabetes - Biguanides Failed - 10/07/2023  5:20 PM      Failed - B12 Level in normal range and within 720 days    No results found for: VITAMINB12       Failed - CBC within normal limits and completed in the last 12 months    WBC  Date Value Ref Range Status  04/16/2022 6.6 3.4 - 10.8 x10E3/uL Final  11/30/2019 10.3 4.0 - 10.5 K/uL Final   RBC  Date Value Ref Range Status  04/16/2022 4.89 4.14 - 5.80 x10E6/uL Final  11/30/2019 5.37 4.22 - 5.81 MIL/uL Final   Hemoglobin  Date Value Ref Range Status  04/16/2022 14.7 13.0 - 17.7 g/dL Final   Hematocrit  Date Value Ref Range Status  04/16/2022 42.8 37.5 - 51.0 % Final   MCHC  Date Value Ref Range Status  04/16/2022 34.3 31.5 - 35.7 g/dL Final  88/97/7978 66.5 30.0 - 36.0 g/dL Final   [facility]  Date Value Ref Range Status  04/16/2022 30.1 26.6 - 33.0 pg Final  11/30/2019 29.4 26.0 - 34.0 pg Final   MCV  Date Value Ref Range Status  04/16/2022 88 79 - 97 fL Final   No results found for: PLTCOUNTKUC, LABPLAT, POCPLA RDW  Date Value Ref Range Status  04/16/2022 12.2 11.6 - 15.4 % Final         Passed - Cr in normal range and within 360 days    Creatinine, Ser  Date Value Ref Range Status  07/28/2023 0.89 0.76 - 1.27 mg/dL Final         Passed - HBA1C is between 0 and 7.9 and within 180 days    HbA1c, POC (controlled diabetic range)  Date Value Ref Range Status  06/09/2023 5.9 0.0 - 7.0 % Final         Passed - eGFR in normal range and  within 360 days    GFR, Estimated  Date Value Ref Range Status  11/30/2019 >60 >60 mL/min Final    Comment:    (NOTE) Calculated using the CKD-EPI Creatinine Equation (2021)    eGFR  Date Value Ref Range Status  07/28/2023 97 >59 mL/min/1.73 Final         Passed - Valid encounter within last 6 months    Recent Outpatient Visits           4 months ago Type 2 diabetes mellitus without complication, without long-term current use of insulin (HCC)   [facility] [place]. [facility] Delbert Clam, MD   11 months ago Type 2 diabetes mellitus without complication, without long-term current use of insulin (HCC)   [facility] [place]. [facility] Brien Belvie BRAVO, MD   1 year ago Type 2 diabetes mellitus without complication, without long-term current use of insulin (HCC)   [facility] [place]  [facility] Brien Belvie BRAVO, MD   2 years ago Type 2 diabetes mellitus without complication, without long-term current use of insulin (HCC)   [facility] [place]. [facility] Brien Belvie BRAVO, MD   2 years ago Type 2 diabetes mellitus without complication, without long-term current use of insulin (HCC)   [facility] [place]. [facility] Brien Belvie BRAVO, MD

## 2023-11-09 ENCOUNTER — Other Ambulatory Visit: Payer: Self-pay | Admitting: Critical Care Medicine

## 2023-11-12 ENCOUNTER — Ambulatory Visit

## 2023-11-12 ENCOUNTER — Ambulatory Visit: Admitting: Physician Assistant

## 2023-11-12 ENCOUNTER — Encounter: Payer: Self-pay | Admitting: Physician Assistant

## 2023-11-12 VITALS — BP 152/80 | HR 84 | Wt 176.2 lb

## 2023-11-12 DIAGNOSIS — E785 Hyperlipidemia, unspecified: Secondary | ICD-10-CM

## 2023-11-12 DIAGNOSIS — I10 Essential (primary) hypertension: Secondary | ICD-10-CM

## 2023-11-12 DIAGNOSIS — E119 Type 2 diabetes mellitus without complications: Secondary | ICD-10-CM

## 2023-11-12 DIAGNOSIS — Z7984 Long term (current) use of oral hypoglycemic drugs: Secondary | ICD-10-CM | POA: Diagnosis not present

## 2023-11-12 DIAGNOSIS — N401 Enlarged prostate with lower urinary tract symptoms: Secondary | ICD-10-CM

## 2023-11-12 LAB — POCT GLYCOSYLATED HEMOGLOBIN (HGB A1C): HbA1c, POC (controlled diabetic range): 6.1 % (ref 0.0–7.0)

## 2023-11-12 NOTE — Patient Instructions (Addendum)
 VISIT SUMMARY:  You came in today for a physical exam and to get refills for your medications. We discussed your current health status, including your diabetes, hypertension, cholesterol, and prostate health. You mentioned feeling unwell due to missing your amlodipine  dose and experiencing stress from your wife's recent accident and hospitalization.  YOUR PLAN:  -ESSENTIAL HYPERTENSION: Hypertension, or high blood pressure, can be influenced by stress and missing medication doses. Your blood pressure was slightly elevated today because you missed your amlodipine  dose. Please ensure you refill your amlodipine  prescription at [facility] and resume taking it as prescribed.  -TYPE 2 DIABETES MELLITUS WITHOUT COMPLICATIONS: Type 2 diabetes is a condition where your body does not use insulin properly. Your diabetes is well-controlled with an A1c of 6.1, which is within the target range. Continue taking metformin  as prescribed to manage your diabetes.  -HYPERLIPIDEMIA: Hyperlipidemia means you have high levels of cholesterol in your blood. Your condition is managed with your current cholesterol medication, which you should continue taking as prescribed.  -BENIGN PROSTATIC HYPERPLASIA WITH LOWER URINARY TRACT SYMPTOMS: Benign prostatic hyperplasia is an enlarged prostate gland that can cause urinary symptoms. You are managing this condition with Flomax , which you should continue taking as prescribed.  How to Take Your Blood Pressure Blood pressure is a measurement of how strongly your blood is pressing against the walls of your arteries. Arteries are blood vessels that carry blood from your heart throughout your body. Your health care provider takes your blood pressure at each office visit. You can also take your own blood pressure at home with a blood pressure monitor. You may need to take your own blood pressure to: Confirm a diagnosis of high blood pressure (hypertension). Monitor your blood pressure  over time. Make sure your blood pressure medicine is working. Supplies needed: Blood pressure monitor. A chair to sit in. This should be a chair where you can sit upright with your back supported. Do not sit on a soft couch or an armchair. Table or desk. Small notebook and pencil or pen. How to prepare To get the most accurate reading, avoid the following for 30 minutes before you check your blood pressure: Drinking caffeine. Drinking alcohol. Eating. Smoking. Exercising. Five minutes before you check your blood pressure: Use the bathroom and urinate so that you have an empty bladder. Sit quietly in a chair. Do not talk. How to take your blood pressure To check your blood pressure, follow the instructions in the manual that came with your blood pressure monitor. If you have a digital blood pressure monitor, the instructions may be as follows: Sit up straight in a chair. Place your feet on the floor. Do not cross your ankles or legs. Rest your left arm at the level of your heart on a table or desk or on the arm of a chair. Pull up your shirt sleeve. Wrap the blood pressure cuff around the upper part of your left arm, 1 inch (2.5 cm) above your elbow. It is best to wrap the cuff around bare skin. Fit the cuff snugly, but not too tightly, around your arm. You should be able to place only one finger between the cuff and your arm. Position the cord so that it rests in the bend of your elbow. Press the power button. Sit quietly while the cuff inflates and deflates. Read the digital reading on the monitor screen and write the numbers down (record them) in a notebook. Wait 2-3 minutes, then repeat the steps, starting at step  1. What does my blood pressure reading mean? A blood pressure reading consists of a higher number over a lower number. Ideally, your blood pressure should be below 120/80. The first (top) number is called the systolic pressure. It is a measure of the pressure in your  arteries as your heart beats. The second (bottom) number is called the diastolic pressure. It is a measure of the pressure in your arteries as the heart relaxes. Blood pressure is classified into four stages. The following are the stages for adults who do not have a short-term serious illness or a chronic condition. Systolic pressure and diastolic pressure are measured in a unit called mm Hg (millimeters of mercury).  Normal Systolic pressure: below 120. Diastolic pressure: below 80. Elevated Systolic pressure: 120-129. Diastolic pressure: below 80. Hypertension stage 1 Systolic pressure: 130-139. Diastolic pressure: 80-89. Hypertension stage 2 Systolic pressure: 140 or above. Diastolic pressure: 90 or above. You can have elevated blood pressure or hypertension even if only the systolic or only the diastolic number in your reading is higher than normal. Follow these instructions at home: Medicines Take over-the-counter and prescription medicines only as told by your health care provider. Tell your health care provider if you are having any side effects from blood pressure medicine. General instructions Check your blood pressure as often as recommended by your health care provider. Check your blood pressure at the same time every day. Take your monitor to the next appointment with your health care provider to make sure that: You are using it correctly. It provides accurate readings. Understand what your goal blood pressure numbers are. Keep all follow-up visits. This is important. General tips Your health care provider can suggest a reliable monitor that will meet your needs. There are several types of home blood pressure monitors. Choose a monitor that has an arm cuff. Do not choose a monitor that measures your blood pressure from your wrist or finger. Choose a cuff that wraps snugly, not too tight or too loose, around your upper arm. You should be able to fit only one finger between  your arm and the cuff. You can buy a blood pressure monitor at most drugstores or online. Where to find more information American Heart Association: www.heart.org Contact a health care provider if: Your blood pressure is consistently high. Your blood pressure is suddenly low. Get help right away if: Your systolic blood pressure is higher than 180. Your diastolic blood pressure is higher than 120. These symptoms may be an emergency. Get help right away. Call 911. Do not wait to see if the symptoms will go away. Do not drive yourself to the hospital. Summary Blood pressure is a measurement of how strongly your blood is pressing against the walls of your arteries. A blood pressure reading consists of a higher number over a lower number. Ideally, your blood pressure should be below 120/80. Check your blood pressure at the same time every day. Avoid caffeine, alcohol, smoking, and exercise for 30 minutes prior to checking your blood pressure. These agents can affect the accuracy of the blood pressure reading. This information is not intended to replace advice given to you by your health care provider. Make sure you discuss any questions you have with your health care provider. Document Revised: 09/28/2020 Document Reviewed: 09/28/2020 Elsevier Patient Education  2024 [employer].

## 2023-11-12 NOTE — Progress Notes (Unsigned)
 Established Patient Office Visit  Subjective   Patient ID: Corey Mason, male    DOB: 1960-02-28  Age: 63 y.o. MRN: [id number]  Chief Complaint  Patient presents with   Medication Refill   Diabetes   Discussed the use of AI scribe software for clinical note transcription with the patient, who gave verbal consent to proceed.  History of Present Illness   Corey Mason is a 63 year old male with diabetes and hypertension who presents for a physical exam and medication refills.  He needs a refill for amlodipine , which he has run out of today, and missed his dose, feeling unwell as a result. His other medications, including metformin , Flomax , and cholesterol medication, have refills available. He is experiencing stress due to his wife's recent accident and hospitalization, which is affecting his well-being. He is here to check his A1c levels, with the last recorded level being 6.1. He has regular lab work, with the last set done in June, and follows up every six months for checkups.    Past Medical History:  Diagnosis Date   Hearing loss of right ear due to cerumen impaction 06/23/2019   Hypertension    Migraines    Urinary retention    Social History   Socioeconomic History   Marital status: Married    Spouse name: Not on file   Number of children: Not on file   Years of education: Not on file   Highest education level: Not on file  Occupational History   Not on file  Tobacco Use   Smoking status: Never   Smokeless tobacco: Never  Substance and Sexual Activity   Alcohol use: Not Currently   Drug use: Never   Sexual activity: Not Currently  Other Topics Concern   Not on file  Social History Narrative   ** Merged History Encounter **       Social Drivers of Health   Financial Resource Strain: Not on file  Food Insecurity: Not on file  Transportation Needs: Not on file  Physical Activity: Not on file  Stress: Not on file  Social Connections: Not on file   Intimate Partner Violence: Not on file   No family history on file. No Known Allergies  Review of Systems  Constitutional: Negative.   HENT: Negative.    Eyes: Negative.   Respiratory:  Negative for shortness of breath.   Cardiovascular:  Negative for chest pain.  Gastrointestinal: Negative.   Genitourinary: Negative.   Musculoskeletal: Negative.   Skin: Negative.   Neurological: Negative.   Endo/Heme/Allergies: Negative.   Psychiatric/Behavioral: Negative.        Objective:     There were no vitals taken for this visit. BP Readings from Last 3 Encounters:  06/09/23 137/76  10/17/22 120/72  06/05/22 135/78   Wt Readings from Last 3 Encounters:  06/09/23 174 lb (78.9 kg)  10/17/22 178 lb 3.2 oz (80.8 kg)  06/05/22 166 lb 9.6 oz (75.6 kg)    Physical Exam Vitals and nursing note reviewed.    GENERAL: Alert, cooperative, well developed, no acute distress HEENT: Normocephalic, normal oropharynx, moist mucous membranes CHEST: Clear to auscultation bilaterally, No wheezes, rhonchi, or crackles CARDIOVASCULAR: Normal heart rate and rhythm, S1 and S2 normal without murmurs EXTREMITIES: No cyanosis or edema NEUROLOGICAL: Cranial nerves grossly intact, Moves all extremities without gross motor or sensory deficit    Assessment & Plan:   Problem List Items Addressed This Visit   None  Results LABS   A1c:  6.1% (06/2023)  Assessment and Plan Essential hypertension Blood pressure slightly elevated due to stress and missed amlodipine  dose. Out of amlodipine  since today. The current medical regimen is effective;  continue present plan and medication  Type 2 diabetes mellitus without complications Diabetes well-controlled with A1c of 6.1, slightly increased from 5.9 but within target range. - Continue current diabetes management regimen with metformin .  Hyperlipidemia Managed with cholesterol medication, refills available. - Continue current management with  cholesterol medication.  Benign prostatic hyperplasia with lower urinary tract symptoms Managed with Flomax , last refilled in July with 90-day supply and two refills remaining. - Continue current management with Flomax .  The patient was given clear instructions to go to ER or return to medical center if symptoms don't improve, worsen or new problems develop. The patient verbalized understanding.    I have reviewed the patient's medical history (PMH, PSH, Social History, Family History, Medications, and allergies) , and have been updated if relevant. I spent 30 minutes reviewing chart and  face to face time with patient.    No follow-ups on file.    Corey RAMAN Mayers, PA-C

## 2023-11-13 ENCOUNTER — Encounter: Payer: Self-pay | Admitting: Physician Assistant

## 2023-12-10 ENCOUNTER — Encounter: Payer: Self-pay | Admitting: Family Medicine

## 2023-12-10 ENCOUNTER — Ambulatory Visit: Attending: Family Medicine | Admitting: Family Medicine

## 2023-12-10 VITALS — BP 126/69 | HR 75 | Temp 98.8°F | Ht 68.0 in | Wt 181.6 lb

## 2023-12-10 DIAGNOSIS — Z7984 Long term (current) use of oral hypoglycemic drugs: Secondary | ICD-10-CM | POA: Diagnosis not present

## 2023-12-10 DIAGNOSIS — E1169 Type 2 diabetes mellitus with other specified complication: Secondary | ICD-10-CM

## 2023-12-10 DIAGNOSIS — E1159 Type 2 diabetes mellitus with other circulatory complications: Secondary | ICD-10-CM | POA: Diagnosis not present

## 2023-12-10 DIAGNOSIS — E119 Type 2 diabetes mellitus without complications: Secondary | ICD-10-CM

## 2023-12-10 DIAGNOSIS — E785 Hyperlipidemia, unspecified: Secondary | ICD-10-CM

## 2023-12-10 DIAGNOSIS — N401 Enlarged prostate with lower urinary tract symptoms: Secondary | ICD-10-CM

## 2023-12-10 DIAGNOSIS — I152 Hypertension secondary to endocrine disorders: Secondary | ICD-10-CM

## 2023-12-10 DIAGNOSIS — N138 Other obstructive and reflux uropathy: Secondary | ICD-10-CM

## 2023-12-10 MED ORDER — ACCU-CHEK GUIDE TEST VI STRP: ORAL_STRIP | 3 refills | Status: AC

## 2023-12-10 MED ORDER — ACCU-CHEK SOFTCLIX LANCETS MISC
12 refills | Status: AC
Start: 2023-12-10 — End: ?

## 2023-12-10 MED ORDER — ATORVASTATIN CALCIUM 10 MG PO TABS: 10.0000 mg | ORAL_TABLET | Freq: Every day | ORAL | 2 refills | Status: AC

## 2023-12-10 MED ORDER — TAMSULOSIN HCL 0.4 MG PO CAPS: 0.4000 mg | ORAL_CAPSULE | Freq: Every day | ORAL | 2 refills | Status: AC

## 2023-12-10 MED ORDER — AMLODIPINE BESYLATE 10 MG PO TABS: 10.0000 mg | ORAL_TABLET | Freq: Every day | ORAL | 2 refills | Status: AC

## 2023-12-10 MED ORDER — METFORMIN HCL ER 500 MG PO TB24: ORAL_TABLET | ORAL | 2 refills | Status: AC

## 2023-12-10 NOTE — Progress Notes (Signed)
 Subjective:  Patient ID: Corey Mason, male    DOB: [birth year]  Age: 63 y.o. MRN: [id number]  CC: Medical Management of Chronic Issues ( )     Discussed the use of AI scribe software for clinical note transcription with the patient, who gave verbal consent to proceed.  History of Present Illness Corey Mason is a 63 year old male with  a history of hypertension, type 2 diabetes mellitus, BPH who presents for a follow-up visit.  He takes amlodipine , tamsulosin , and metformin  regularly. Blood pressure is well-controlled, and A1c is 6.1. Kidney and liver function tests in June and July were normal, as were cholesterol levels. He had an eye exam earlier this year and plans regular check-ups every two years.There is no numbness in his hands or feet and no episodes of hypoglycemia. He denies any pain or discomfort. He engages in physical activity about three times a week, although sometimes forgets to exercise.  Denies presence of additional concerns today.  Past Medical History:  Diagnosis Date   Hearing loss of right ear due to cerumen impaction 06/23/2019   Hypertension    Migraines    Urinary retention     No past surgical history on file.  No family history on file.  Social History   Socioeconomic History   Marital status: Married    Spouse name: Not on file   Number of children: Not on file   Years of education: Not on file   Highest education level: Not on file  Occupational History   Not on file  Tobacco Use   Smoking status: Never   Smokeless tobacco: Never  Substance and Sexual Activity   Alcohol use: Not Currently   Drug use: Never   Sexual activity: Not Currently  Other Topics Concern   Not on file  Social History Narrative   ** Merged History Encounter **       Social Drivers of Health   Financial Resource Strain: Not on file  Food Insecurity: Not on file  Transportation Needs: Not on file  Physical Activity: Not on file  Stress: Not on file   Social Connections: Not on file    No Known Allergies  Outpatient Medications Prior to Visit  Medication Sig Dispense Refill   Blood Glucose Monitoring Suppl (ACCU-CHEK GUIDE) w/Device KIT Use to check blood sugar 1 kit 0   Accu-Chek Softclix Lancets lancets Use as instructed 100 each 12   amLODipine  (NORVASC ) 10 MG tablet TAKE 1 TABLET(10 MG) BY MOUTH DAILY 90 tablet 2   atorvastatin  (LIPITOR) 10 MG tablet TAKE 1 TABLET(10 MG) BY MOUTH DAILY 90 tablet 2   glucose blood (ACCU-CHEK GUIDE) test strip Use as instructed 100 each 12   metFORMIN  (GLUCOPHAGE -XR) 500 MG 24 hr tablet TAKE 1 TABLET(500 MG) BY MOUTH DAILY WITH BREAKFAST 90 tablet 2   tamsulosin  (FLOMAX ) 0.4 MG CAPS capsule TAKE 1 CAPSULE(0.4 MG) BY MOUTH DAILY 90 capsule 2   No facility-administered medications prior to visit.     ROS Review of Systems  Constitutional:  Negative for activity change and appetite change.  HENT:  Negative for sinus pressure and sore throat.   Respiratory:  Negative for chest tightness, shortness of breath and wheezing.   Cardiovascular:  Negative for chest pain and palpitations.  Gastrointestinal:  Negative for abdominal distention, abdominal pain and constipation.  Genitourinary: Negative.   Musculoskeletal: Negative.   Psychiatric/Behavioral:  Negative for behavioral problems and dysphoric mood.     Objective:  BP  126/69   Pulse 75   Temp 98.8 F (37.1 C) (Oral)   Ht 5' 8 (1.727 m)   Wt 181 lb 9.6 oz (82.4 kg)   SpO2 99%   BMI 27.61 kg/m      12/10/2023    1:34 PM 11/12/2023    9:49 AM 06/09/2023    2:04 PM  BP/Weight  Systolic BP 126 152 137  Diastolic BP 69 80 76  Wt. (Lbs) 181.6 176.2 174  BMI 27.61 kg/m2 26.79 kg/m2 26.46 kg/m2      Physical Exam Constitutional:      Appearance: He is well-developed.  Cardiovascular:     Rate and Rhythm: Normal rate.     Heart sounds: Normal heart sounds. No murmur heard. Pulmonary:     Effort: Pulmonary effort is normal.      Breath sounds: Normal breath sounds. No wheezing or rales.  Chest:     Chest wall: No tenderness.  Abdominal:     General: Bowel sounds are normal. There is no distension.     Palpations: Abdomen is soft. There is no mass.     Tenderness: There is no abdominal tenderness.  Musculoskeletal:        General: Normal range of motion.     Right lower leg: No edema.     Left lower leg: No edema.  Neurological:     Mental Status: He is alert and oriented to person, place, and time.  Psychiatric:        Mood and Affect: Mood normal.        Latest Ref Rng & Units 07/28/2023    9:56 AM 10/17/2022   11:38 AM 04/16/2022   11:07 AM  CMP  Glucose 70 - 99 mg/dL 890  71  884   BUN 8 - 27 mg/dL 17  12  9    Creatinine 0.76 - 1.27 mg/dL 9.10  9.10  9.20   Sodium 134 - 144 mmol/L 140  143  139   Potassium 3.5 - 5.2 mmol/L 4.4  4.3  4.7   Chloride 96 - 106 mmol/L 101  103  103   CO2 20 - 29 mmol/L 21  24  24    Calcium  8.6 - 10.2 mg/dL 9.4  9.0  8.9   Total Protein 6.0 - 8.5 g/dL 7.3  7.1  6.7   Total Bilirubin 0.0 - 1.2 mg/dL 0.4  0.4  0.3   Alkaline Phos 44 - 121 IU/L 69  68  78   AST 0 - 40 IU/L 15  13  12    ALT 0 - 44 IU/L 15  19  14      Lipid Panel     Component Value Date/Time   CHOL 124 07/28/2023 0956   TRIG 35 07/28/2023 0956   HDL 47 07/28/2023 0956   CHOLHDL 2.4 04/16/2022 1107   LDLCALC 68 07/28/2023 0956    CBC    Component Value Date/Time   WBC 6.6 04/16/2022 1107   WBC 10.3 11/30/2019 1214   RBC 4.89 04/16/2022 1107   RBC 5.37 11/30/2019 1214   HGB 14.7 04/16/2022 1107   HCT 42.8 04/16/2022 1107   PLT 266 04/16/2022 1107   MCV 88 04/16/2022 1107   MCH 30.1 04/16/2022 1107   MCH 29.4 11/30/2019 1214   MCHC 34.3 04/16/2022 1107   MCHC 33.4 11/30/2019 1214   RDW 12.2 04/16/2022 1107   LYMPHSABS 1.4 04/16/2022 1107   MONOABS 0.6 11/30/2019 1214   EOSABS 0.1 04/16/2022  1107   BASOSABS 0.0 04/16/2022 1107    Lab Results  Component Value Date   HGBA1C 6.1  11/12/2023       Assessment & Plan Type 2 diabetes mellitus Well-controlled with A1c of 6.1. No hypoglycemia symptoms. - Continue metformin  500 mg oral daily with breakfast. - Ordered basic metabolic panel to assess kidney function. - Ordered Accu-chek softclix lancets for blood glucose monitoring. - Encouraged regular exercise. -Counseled on Diabetic diet, the healthy plate, 849 minutes of moderate intensity exercise/week Blood sugar logs with fasting goals of 80-120 mg/dl, random of less than 819 and in the event of sugars less than 60 mg/dl or greater than 599 mg/dl encouraged to notify the clinic. Advised on the need for annual eye exams, annual foot exams, Pneumonia vaccine.   Hypertension associated with type 2 diabetes mellitus Well-controlled with current medication regimen. - Continue amlodipine  10 mg oral daily. -Counseled on blood pressure goal of less than 130/80, low-sodium, DASH diet, medication compliance, 150 minutes of moderate intensity exercise per week. Discussed medication compliance, adverse effects.   Benign prostatic hyperplasia Managed with tamsulosin . No new symptoms. - Continue tamsulosin  0.4 mg oral daily.     Healthcare maintenance Up-to-date on screening for prostate cancer  Meds ordered this encounter  Medications   amLODipine  (NORVASC ) 10 MG tablet    Sig: Take 1 tablet (10 mg total) by mouth daily.    Dispense:  90 tablet    Refill:  2   atorvastatin  (LIPITOR) 10 MG tablet    Sig: Take 1 tablet (10 mg total) by mouth daily.    Dispense:  90 tablet    Refill:  2   metFORMIN  (GLUCOPHAGE -XR) 500 MG 24 hr tablet    Sig: TAKE 1 TABLET(500 MG) BY MOUTH DAILY WITH BREAKFAST    Dispense:  90 tablet    Refill:  2   tamsulosin  (FLOMAX ) 0.4 MG CAPS capsule    Sig: Take 1 capsule (0.4 mg total) by mouth daily.    Dispense:  90 capsule    Refill:  2   glucose blood (ACCU-CHEK GUIDE TEST) test strip    Sig: Use as instructed daily    Dispense:   100 each    Refill:  3   Accu-Chek Softclix Lancets lancets    Sig: Use as instructed    Dispense:  100 each    Refill:  12    Follow-up: Return in about 6 months (around 06/08/2024) for Chronic medical conditions.       Corrina Sabin, MD, FAAFP. [facility] [place], [place] [phone]   12/10/2023, 4:52 PM

## 2023-12-10 NOTE — Patient Instructions (Signed)
 VISIT SUMMARY:  You had a follow-up visit today to check on your diabetes, high blood pressure, and prostate health. Your blood pressure and blood sugar levels are well-controlled, and your recent kidney and liver function tests, as well as cholesterol levels, were normal. You are not experiencing any pain, numbness, or hypoglycemia symptoms, and you are staying active, although you sometimes forget to exercise.  YOUR PLAN:  -TYPE 2 DIABETES MELLITUS: Type 2 diabetes is a condition where your body does not use insulin properly, leading to high blood sugar levels. Your diabetes is well-controlled with an A1c of 6.1. Continue taking metformin  500 mg daily with breakfast. A basic metabolic panel has been ordered to check your kidney function, and Accu-chek softclix lancets have been ordered for blood glucose monitoring. Please continue regular exercise.  -HYPERTENSION: Hypertension, or high blood pressure, is when the force of your blood against your artery walls is too high. Your blood pressure is well-controlled with your current medication. Continue taking amlodipine  10 mg daily.  -BENIGN PROSTATIC HYPERPLASIA: Benign prostatic hyperplasia is an enlarged prostate gland that can cause urinary symptoms. Your condition is managed with tamsulosin , and you have no new symptoms. Continue taking tamsulosin  0.4 mg daily.  INSTRUCTIONS:  Please follow up with the basic metabolic panel to assess your kidney function. Continue with your regular eye exams and physical activity. If you experience any new symptoms or have concerns, schedule an appointment.

## 2023-12-11 ENCOUNTER — Ambulatory Visit: Payer: Self-pay | Admitting: Family Medicine

## 2023-12-11 LAB — BASIC METABOLIC PANEL WITH GFR
BUN/Creatinine Ratio: 17 (ref 10–24)
BUN: 13 mg/dL (ref 8–27)
CO2: 23 mmol/L (ref 20–29)
Calcium: 9.2 mg/dL (ref 8.6–10.2)
Chloride: 101 mmol/L (ref 96–106)
Creatinine, Ser: 0.78 mg/dL (ref 0.76–1.27)
Glucose: 106 mg/dL — ABNORMAL HIGH (ref 70–99)
Potassium: 4.5 mmol/L (ref 3.5–5.2)
Sodium: 137 mmol/L (ref 134–144)
eGFR: 101 mL/min/1.73 (ref >59)

## 2024-06-08 ENCOUNTER — Ambulatory Visit: Payer: Self-pay | Admitting: Family Medicine
